# Patient Record
Sex: Male | Born: 1960
Health system: Southern US, Community
[De-identification: ages and names within clinical notes are randomized; demographics above are authoritative.]

## PROBLEM LIST (undated history)

## (undated) DIAGNOSIS — E119 Type 2 diabetes mellitus without complications: Secondary | ICD-10-CM

## (undated) DIAGNOSIS — K219 Gastro-esophageal reflux disease without esophagitis: Secondary | ICD-10-CM

## (undated) DIAGNOSIS — E7800 Pure hypercholesterolemia, unspecified: Secondary | ICD-10-CM

## (undated) DIAGNOSIS — G4733 Obstructive sleep apnea (adult) (pediatric): Secondary | ICD-10-CM

## (undated) DIAGNOSIS — K824 Cholesterolosis of gallbladder: Secondary | ICD-10-CM

## (undated) DIAGNOSIS — G4730 Sleep apnea, unspecified: Secondary | ICD-10-CM

## (undated) DIAGNOSIS — I2510 Atherosclerotic heart disease of native coronary artery without angina pectoris: Secondary | ICD-10-CM

## (undated) DIAGNOSIS — K449 Diaphragmatic hernia without obstruction or gangrene: Secondary | ICD-10-CM

## (undated) DIAGNOSIS — K2270 Barrett's esophagus without dysplasia: Secondary | ICD-10-CM

## (undated) DIAGNOSIS — K317 Polyp of stomach and duodenum: Secondary | ICD-10-CM

## (undated) DIAGNOSIS — I82409 Acute embolism and thrombosis of unspecified deep veins of unspecified lower extremity: Secondary | ICD-10-CM

## (undated) DIAGNOSIS — K760 Fatty (change of) liver, not elsewhere classified: Secondary | ICD-10-CM

## (undated) DIAGNOSIS — F419 Anxiety disorder, unspecified: Secondary | ICD-10-CM

## (undated) DIAGNOSIS — R7989 Other specified abnormal findings of blood chemistry: Secondary | ICD-10-CM

## (undated) DIAGNOSIS — R945 Abnormal results of liver function studies: Secondary | ICD-10-CM

## (undated) HISTORY — DX: Diaphragmatic hernia without obstruction or gangrene: K44.9

## (undated) HISTORY — DX: Other specified abnormal findings of blood chemistry: R79.89

## (undated) HISTORY — DX: Obstructive sleep apnea (adult) (pediatric): G47.33

## (undated) HISTORY — DX: Cholesterolosis of gallbladder: K82.4

## (undated) HISTORY — DX: Acute embolism and thrombosis of unspecified deep veins of unspecified lower extremity: I82.409

## (undated) HISTORY — DX: Type 2 diabetes mellitus without complications: E11.9

## (undated) HISTORY — DX: Barrett's esophagus without dysplasia: K22.70

## (undated) HISTORY — DX: Gastro-esophageal reflux disease without esophagitis: K21.9

## (undated) HISTORY — DX: Atherosclerotic heart disease of native coronary artery without angina pectoris: I25.10

## (undated) HISTORY — DX: Polyp of stomach and duodenum: K31.7

## (undated) HISTORY — DX: Fatty (change of) liver, not elsewhere classified: K76.0

## (undated) HISTORY — DX: Abnormal results of liver function studies: R94.5

## (undated) HISTORY — DX: Anxiety disorder, unspecified: F41.9

## (undated) HISTORY — DX: Pure hypercholesterolemia, unspecified: E78.00

## (undated) HISTORY — PX: COLONOSCOPY: SHX174

## (undated) HISTORY — DX: Sleep apnea, unspecified: G47.30

## (undated) HISTORY — PX: UPPER GASTROINTESTINAL ENDOSCOPY: SHX188

---

## 1999-01-15 ENCOUNTER — Other Ambulatory Visit: Admission: RE | Admit: 1999-01-15 | Discharge: 1999-01-15 | Payer: Self-pay | Admitting: Gastroenterology

## 1999-01-15 ENCOUNTER — Encounter (INDEPENDENT_AMBULATORY_CARE_PROVIDER_SITE_OTHER): Payer: Self-pay

## 2004-12-12 ENCOUNTER — Emergency Department (HOSPITAL_COMMUNITY): Admission: EM | Admit: 2004-12-12 | Discharge: 2004-12-12 | Payer: Self-pay | Admitting: Emergency Medicine

## 2006-05-06 ENCOUNTER — Emergency Department (HOSPITAL_COMMUNITY): Admission: EM | Admit: 2006-05-06 | Discharge: 2006-05-06 | Payer: Self-pay | Admitting: Family Medicine

## 2007-03-22 ENCOUNTER — Observation Stay (HOSPITAL_COMMUNITY): Admission: EM | Admit: 2007-03-22 | Discharge: 2007-03-23 | Payer: Self-pay | Admitting: Emergency Medicine

## 2008-02-17 HISTORY — PX: CARDIAC CATHETERIZATION: SHX172

## 2008-04-22 ENCOUNTER — Emergency Department (HOSPITAL_COMMUNITY): Admission: EM | Admit: 2008-04-22 | Discharge: 2008-04-22 | Payer: Self-pay | Admitting: Emergency Medicine

## 2008-07-29 ENCOUNTER — Emergency Department (HOSPITAL_COMMUNITY): Admission: EM | Admit: 2008-07-29 | Discharge: 2008-07-29 | Payer: Self-pay | Admitting: Emergency Medicine

## 2008-11-22 ENCOUNTER — Inpatient Hospital Stay (HOSPITAL_BASED_OUTPATIENT_CLINIC_OR_DEPARTMENT_OTHER): Admission: RE | Admit: 2008-11-22 | Discharge: 2008-11-22 | Payer: Self-pay | Admitting: Cardiology

## 2008-11-27 ENCOUNTER — Ambulatory Visit (HOSPITAL_COMMUNITY): Admission: RE | Admit: 2008-11-27 | Discharge: 2008-11-27 | Payer: Self-pay | Admitting: Cardiology

## 2008-12-24 ENCOUNTER — Ambulatory Visit: Payer: Self-pay | Admitting: Gastroenterology

## 2009-01-14 ENCOUNTER — Ambulatory Visit: Payer: Self-pay | Admitting: Gastroenterology

## 2009-01-18 ENCOUNTER — Encounter: Payer: Self-pay | Admitting: Gastroenterology

## 2009-01-23 ENCOUNTER — Telehealth: Payer: Self-pay | Admitting: Gastroenterology

## 2009-01-23 DIAGNOSIS — R079 Chest pain, unspecified: Secondary | ICD-10-CM | POA: Insufficient documentation

## 2009-01-23 DIAGNOSIS — K219 Gastro-esophageal reflux disease without esophagitis: Secondary | ICD-10-CM | POA: Insufficient documentation

## 2009-01-30 ENCOUNTER — Ambulatory Visit (HOSPITAL_COMMUNITY): Admission: RE | Admit: 2009-01-30 | Discharge: 2009-01-30 | Payer: Self-pay | Admitting: Gastroenterology

## 2009-01-30 ENCOUNTER — Encounter: Payer: Self-pay | Admitting: Gastroenterology

## 2009-01-30 DIAGNOSIS — Z8679 Personal history of other diseases of the circulatory system: Secondary | ICD-10-CM | POA: Insufficient documentation

## 2009-01-30 DIAGNOSIS — K2270 Barrett's esophagus without dysplasia: Secondary | ICD-10-CM | POA: Insufficient documentation

## 2009-01-30 DIAGNOSIS — R1310 Dysphagia, unspecified: Secondary | ICD-10-CM | POA: Insufficient documentation

## 2009-01-30 DIAGNOSIS — K824 Cholesterolosis of gallbladder: Secondary | ICD-10-CM | POA: Insufficient documentation

## 2009-02-26 ENCOUNTER — Ambulatory Visit: Payer: Self-pay | Admitting: Gastroenterology

## 2009-02-26 DIAGNOSIS — K7581 Nonalcoholic steatohepatitis (NASH): Secondary | ICD-10-CM | POA: Insufficient documentation

## 2009-03-16 IMAGING — CR DG CHEST 1V PORT
1 series · 1 of 1 positions shown · non-contrast
Comparison: none

CLINICAL DATA: Chest pain.
 PORTABLE CHEST- 1 VIEW:

[AP]
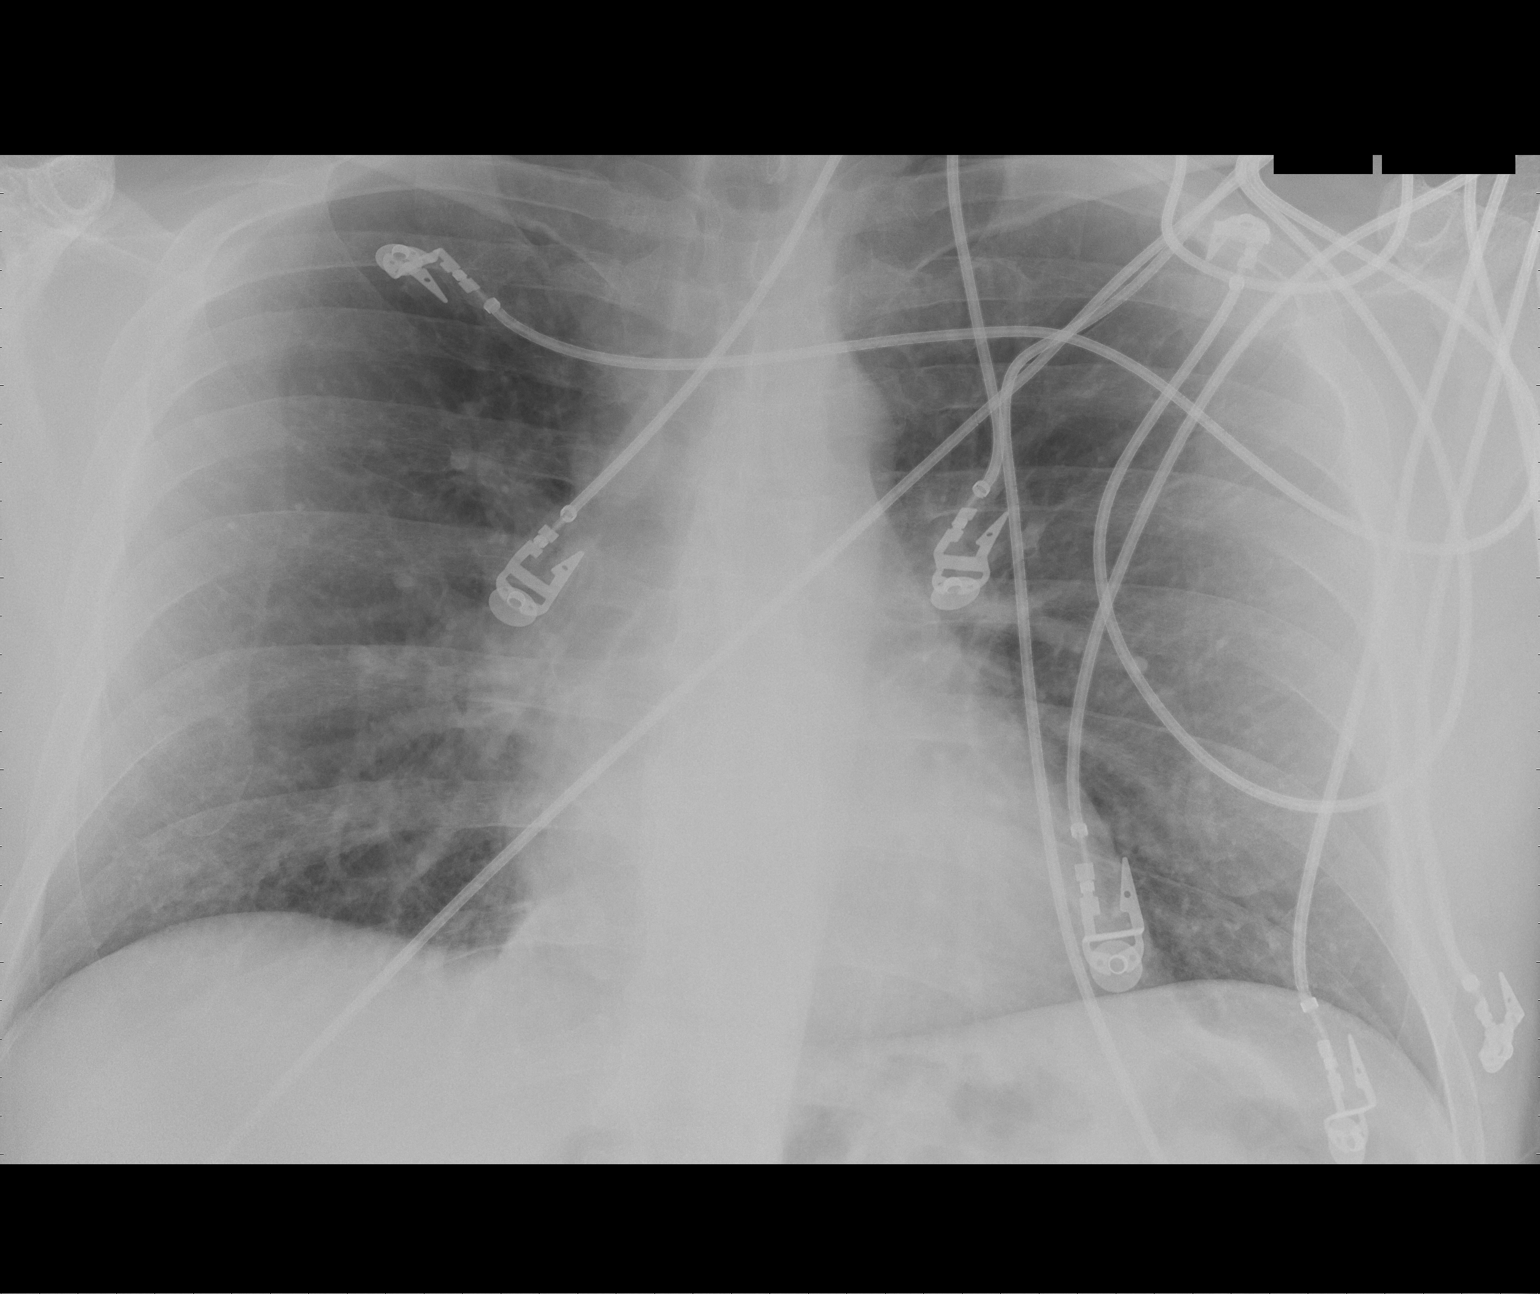

[1 of 1 positions shown; findings below may reference images not displayed]

FINDINGS: Lungs clear.  Heart size normal.  No effusion or focal bony abnormality.
IMPRESSION: No acute disease.

## 2009-04-08 ENCOUNTER — Encounter: Payer: Self-pay | Admitting: Gastroenterology

## 2010-02-11 ENCOUNTER — Encounter: Payer: Self-pay | Admitting: Gastroenterology

## 2010-03-18 NOTE — Assessment & Plan Note (Signed)
Summary: OV re Ultrasound/dfs   History of Present Illness Visit Type: Initial Visit Primary GI MD: Sheryn Bison MD FACP FAGA Primary Provider: Lajean Manes, MD Chief Complaint: Barrett's esophagus History of Present Illness:   This patient is a 50 year old white male with chronic GERD benign recurrent hiatal hernia with an associated esophageal motility disturbance associated with lower esophageal sphincter incompetency and hypo--motility in the distal two thirds of his esophagus. He recently has had recurrent subxiphoid chest pain with a negative cardiac evaluation and a negative ultrasound exam except for gallbladder polyp and mild fatty infiltration of his liver with normal liver function tests. He is obese and has hyperlipidemia. He denies hepatobiliary complaints. Recent endoscopy did confirm a short segment of Barrett's mucosa. Since increasing his AcipHex to 20 mg twice a day he's had no further chest pain.  Review of his workup does show some prominent gastric polyps but these were hypoplastic with mucosal biopsies. He denies lower gastrointestinal problems at this time. He has gained approximately 20 pounds in weight over the last few years.   GI Review of Systems    Reports acid reflux, belching, chest pain, and  weight gain.      Denies abdominal pain, bloating, dysphagia with liquids, heartburn, loss of appetite, nausea, vomiting, vomiting blood, and  weight loss.        Denies anal fissure, black tarry stools, change in bowel habit, constipation, diarrhea, diverticulosis, fecal incontinence, heme positive stool, hemorrhoids, irritable bowel syndrome, jaundice, light color stool, liver problems, rectal bleeding, and  rectal pain. Preventive Screening-Counseling & Management  Alcohol-Tobacco     Smoking Status: never      Drug Use:  no.      Current Medications (verified): 1)  Aciphex 20 Mg  Tbec (Rabeprazole Sodium) .Marland Kitchen.. 1 By Mouth Bid  Allergies (verified): No Known  Drug Allergies  Past History:  Past medical, surgical, family and social histories (including risk factors) reviewed for relevance to current acute and chronic problems.  Past Medical History: Reviewed history from 01/30/2009 and no changes required. Current Problems:  CHEST PAIN, HX OF (ICD-V12.50) DYSPHAGIA UNSPECIFIED (ICD-787.20) GASTRIC POLYP (ICD-211.1) BARRETTS ESOPHAGUS (ICD-530.85) GERD (ICD-530.81) CHEST PAIN (ICD-786.50)  Family History: Reviewed history and no changes required. No FH of Colon Cancer: Family History of Diabetes: Mother & Father  Social History: Reviewed history and no changes required. 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  Alcohol Use - no Daily Caffeine Use -4 Illicit Drug Use - no Smoking Status:  never Drug Use:  no  Review of Systems       The patient complains of shortness of breath and sleeping problems.  The patient denies allergy/sinus, anemia, anxiety-new, arthritis/joint pain, back pain, blood in urine, breast changes/lumps, change in vision, confusion, cough, coughing up blood, depression-new, fainting, fatigue, fever, headaches-new, hearing problems, heart murmur, heart rhythm changes, itching, menstrual pain, muscle pains/cramps, night sweats, nosebleeds, pregnancy symptoms, skin rash, sore throat, swollen lymph glands, thirst - excessive , urination - excessive , urination changes/pain, urine leakage, vision changes, and voice change.    Vital Signs:  Patient profile:   50 year old male Height:      77 inches Weight:      283.38 pounds BMI:     33.73 Pulse rate:   92 / minute Pulse rhythm:   regular BP sitting:   124 / 78  (left arm) Cuff size:   large  Vitals Entered By: June McMurray CMA Duncan Dull) (February 26, 2009  3:32 PM)  Physical Exam  General:  Well developed, well nourished, no acute distress.He is a very large individual who is very fit but has mild obesity. Head:  Normocephalic and  atraumatic. Eyes:  PERRLA, no icterus.exam deferred to patient's ophthalmologist.   Neck:  Supple; no masses or thyromegaly. Lungs:  Clear throughout to auscultation. Heart:  Regular rate and rhythm; no murmurs, rubs,  or bruits. Abdomen:  Soft, nontender and nondistended. No masses, hepatosplenomegaly or hernias noted. Normal bowel sounds.I cannot appreciate hepatosplenomegaly or abdominal masses. Pulses:  Normal pulses noted. Extremities:  No clubbing, cyanosis, edema or deformities noted. Neurologic:  Alert and  oriented x4;  grossly normal neurologically. Cervical Nodes:  No significant cervical adenopathy. Psych:  Alert and cooperative. Normal mood and affect.   Impression & Recommendations:  Problem # 1:  CHEST PAIN, HX OF (ICD-V12.50) Assessment Improved his chest pain is consistent with GERD and his known hiatal hernia.He is currently much improved on b.i.d. AcipHex therapy. I have reviewed his endoscopic findings with him and also a reflux regime and the need for twice a day PPI therapy. He is not a good candidate for fundoplication because of his associated motility disturbance. I have suggested to him that he try to lose 25-30 pounds in weight which will help his fatty liver and has GERD  Problem # 2:  DYSPHAGIA UNSPECIFIED (ICD-787.20) Assessment: Improved  Problem # 3:  BARRETTS ESOPHAGUS (ICD-530.85) Assessment: Unchanged He has a short segment of Barrett's because of which is low risk for carcinoma development. Because of his gastric polyposis, I suggested every two-year endoscopic surveillance however.  Problem # 4:  FATTY LIVER DISEASE (ICD-571.8) Assessment: Unchanged There is no hepatomegaly on physical exam or stigmata of chronic liver disease. Review of his liver function tests showed that these parameters are normal. Still, weight loss reduction is in order and a gradual graded common sense approach with continued aerobic exercise planning.  Patient  Instructions: 1)  Copy sent to : Dr. Lajean Manes 2)  Please continue current medications.  3)  Fatty Liver handout given.  4)  Avoid foods high in acid content ( tomatoes, citrus juices, spicy foods) . Avoid eating within 3 to 4 hours of lying down or before exercising. Do not over eat; try smaller more frequent meals. Elevate head of bed four inches when sleeping.  5)  Yearly GI followup with endoscopy at two-year intervals. 6)  Aciphex Rx sent to pharmacy. 7)  The medication list was reviewed and reconciled.  All changed / newly prescribed medications were explained.  A complete medication list was provided to the patient / caregiver. Prescriptions: ACIPHEX 20 MG  TBEC (RABEPRAZOLE SODIUM) 1 by mouth bid  #180 x 3   Entered by:   Ashok Cordia RN   Authorized by:   Mardella Layman MD [facility]   Signed by:   Ashok Cordia RN on 02/26/2009   Method used:   Electronically to        [facility] [address] (retail)       [address]       [place], [place]  [address]       Ph: [phone]       Fax: [phone]   RxID:   [id number]

## 2010-03-18 NOTE — Medication Information (Signed)
Summary: Approved/[employer]  Approved/[employer]   Imported By: [employer] 04/15/2009 09:09:53  _____________________________________________________________________  External Attachment:    Type:   Image     Comment:   External Document

## 2010-03-20 NOTE — Letter (Signed)
Summary: Office Visit Letter  [facility]  [address]   [place], [place] [address]   Phone: [phone]  Fax: [phone]      February 11, 2010 MRN: [id number]   Timothy Dixon [address] [place], [place]  [address]   Dear [name],   According to our records, it is time for you to schedule a follow-up office visit with [place].   At your convenience, please call [phone] (option #2)to schedule an office visit. If you have any questions, concerns, or feel that this letter is in error, we would appreciate your call.   Sincerely,   Sheryn Bison M.D.  [facility] [phone]

## 2010-04-03 ENCOUNTER — Telehealth: Payer: Self-pay | Admitting: Gastroenterology

## 2010-04-09 NOTE — Progress Notes (Signed)
Summary: Med refill  Phone Note Call from Patient Call back at Home Phone [phone]   Caller: Patient Call For: Dr. Jarold Motto Reason for Call: Refill Medication Summary of Call: REFILL ON aciphex to [facility] Initial call taken by: [place] Johnson,  April 03, 2010 2:46 PM  Follow-up for Phone Call        [facility] for patient to return our call. Graciella Freer RN  April 03, 2010 3:26 PM   [facility] for patient stating  I ordered his ACIPHEX and reminded him of his 04/15/10 appointment with Dr Jarold Motto. Follow-up by: Graciella Freer RN,  April 04, 2010 8:13 AM    New/Updated Medications: ACIPHEX 20 MG  TBEC (RABEPRAZOLE SODIUM) 1 by mouth bid Prescriptions: ACIPHEX 20 MG  TBEC (RABEPRAZOLE SODIUM) 1 by mouth bid  #180 x 3   Entered by:   Graciella Freer RN   Authorized by:   Mardella Layman MD [facility]   Signed by:   Graciella Freer RN on 04/03/2010   Method used:   Electronically to        [facility] [address] (retail)       [address], [address]       [place], [place]  [address]       Ph: [phone]       Fax: [phone]   RxID:   [id number]

## 2010-04-15 ENCOUNTER — Encounter: Payer: Self-pay | Admitting: Gastroenterology

## 2010-04-15 ENCOUNTER — Other Ambulatory Visit: Payer: Self-pay | Admitting: Gastroenterology

## 2010-04-15 ENCOUNTER — Other Ambulatory Visit: Payer: 59

## 2010-04-15 ENCOUNTER — Ambulatory Visit (INDEPENDENT_AMBULATORY_CARE_PROVIDER_SITE_OTHER): Payer: 59 | Admitting: Gastroenterology

## 2010-04-15 DIAGNOSIS — K7689 Other specified diseases of liver: Secondary | ICD-10-CM

## 2010-04-15 DIAGNOSIS — K219 Gastro-esophageal reflux disease without esophagitis: Secondary | ICD-10-CM

## 2010-04-15 DIAGNOSIS — K2270 Barrett's esophagus without dysplasia: Secondary | ICD-10-CM

## 2010-04-15 LAB — BASIC METABOLIC PANEL
BUN: 17 mg/dL (ref 6–23)
CO2: 28 mEq/L (ref 19–32)
Chloride: 105 mEq/L (ref 96–112)
Creatinine, Ser: 1.4 mg/dL (ref 0.4–1.5)

## 2010-04-15 LAB — CBC WITH DIFFERENTIAL/PLATELET
Basophils Relative: 0.3 % (ref 0.0–3.0)
Hemoglobin: 14.5 g/dL (ref 13.0–17.0)
Lymphocytes Relative: 31.6 % (ref 12.0–46.0)
Monocytes Relative: 13.2 % — ABNORMAL HIGH (ref 3.0–12.0)
Neutro Abs: 3.2 10*3/uL (ref 1.4–7.7)
RBC: 5.09 Mil/uL (ref 4.22–5.81)

## 2010-04-15 LAB — HEPATIC FUNCTION PANEL
ALT: 93 U/L — ABNORMAL HIGH (ref 0–53)
Bilirubin, Direct: 0.1 mg/dL (ref 0.0–0.3)
Total Protein: 7.1 g/dL (ref 6.0–8.3)

## 2010-04-15 LAB — IBC PANEL: Transferrin: 259.8 mg/dL (ref 212.0–360.0)

## 2010-04-15 LAB — VITAMIN B12: Vitamin B-12: 263 pg/mL (ref 211–911)

## 2010-04-16 ENCOUNTER — Encounter (INDEPENDENT_AMBULATORY_CARE_PROVIDER_SITE_OTHER): Payer: Self-pay | Admitting: *Deleted

## 2010-04-18 ENCOUNTER — Other Ambulatory Visit: Payer: 59

## 2010-04-24 NOTE — Assessment & Plan Note (Signed)
Summary: Recall   History of Present Illness Visit Type: Follow-up Visit Primary GI MD: Sheryn Bison MD FACP FAGA Primary Provider: Lajean Manes, MD Chief Complaint: GERD, hiatal hernia pushing up, need refills of Aciphex History of Present Illness:   50 year old white male with chronic acid reflux with a related  esophageal motility disorder. He is doing well on AcipHex 20 mg twice a day and denies dysphagia. He has had some atypical chest pain and apparently has undergone cardiac catheterization, and was told that he was without coronary artery disease. He does have Barrett's mucosa with endoscopy one year ago. He denies other gastrointestinal or general medical issues. Family history is noncontributory. There no lower gastrointestinal hepatobiliary symptoms. Previous ultrasound did confirm a gallbladder polyp and fatty infiltration of the liver. His BMI is elevated at 34.31.   GI Review of Systems    Reports abdominal pain, acid reflux, bloating, and  nausea.     Location of  Abdominal pain: epigastric area.    Denies belching, chest pain, dysphagia with liquids, dysphagia with solids, heartburn, loss of appetite, vomiting, vomiting blood, weight loss, and  weight gain.        Denies anal fissure, black tarry stools, change in bowel habit, constipation, diarrhea, diverticulosis, fecal incontinence, heme positive stool, hemorrhoids, irritable bowel syndrome, jaundice, light color stool, liver problems, rectal bleeding, and  rectal pain.    Current Medications (verified): 1)  Aciphex 20 Mg  Tbec (Rabeprazole Sodium) .Marland Kitchen.. 1 By Mouth Bid 2)  Trilipix 45 Mg Cpdr (Choline Fenofibrate) .... Take 1 By Mouth Once Daily  Allergies (verified): No Known Drug Allergies  Past History:  Past medical, surgical, family and social histories (including risk factors) reviewed for relevance to current acute and chronic problems.  Past Medical History: Reviewed history from 01/30/2009 and no changes  required. Current Problems:  CHEST PAIN, HX OF (ICD-V12.50) DYSPHAGIA UNSPECIFIED (ICD-787.20) GASTRIC POLYP (ICD-211.1) BARRETTS ESOPHAGUS (ICD-530.85) GERD (ICD-530.81) CHEST PAIN (ICD-786.50)  Family History: Reviewed history from 02/26/2009 and no changes required. No FH of Colon Cancer: Family History of Diabetes: Mother & Father  Social History: Reviewed history from 02/26/2009 and no changes required. 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  Alcohol Use - no Daily Caffeine Use -4 Illicit Drug Use - no  Review of Systems  The patient denies allergy/sinus, anemia, anxiety-new, arthritis/joint pain, back pain, blood in urine, breast changes/lumps, change in vision, confusion, cough, coughing up blood, depression-new, fainting, fatigue, fever, headaches-new, hearing problems, heart murmur, heart rhythm changes, itching, menstrual pain, muscle pains/cramps, night sweats, nosebleeds, pregnancy symptoms, shortness of breath, skin rash, sleeping problems, sore throat, swelling of feet/legs, swollen lymph glands, thirst - excessive , urination - excessive , urination changes/pain, urine leakage, vision changes, and voice change.         he does relate periodic episodes of noncardiac chest pain and also a fullness in the subxiphoid area but no true dysphasia or painful swallowing.  Vital Signs:  Patient profile:   50 year old male Height:      77 inches Weight:      288.25 pounds BMI:     34.31 Pulse rate:   68 / minute Pulse rhythm:   regular BP sitting:   126 / 84  (left arm) Cuff size:   large  Vitals Entered By: June McMurray CMA Duncan Dull) (April 15, 2010 1:54 PM)  Physical Exam  General:  Well developed, well nourished, no acute distress.obese.   Head:  Normocephalic and  atraumatic. Eyes:  PERRLA, no icterus. Lungs:  Clear throughout to auscultation. Heart:  Regular rate and rhythm; no murmurs, rubs,  or bruits. Abdomen:  Soft, nontender and  nondistended. No masses, hepatosplenomegaly or hernias noted. Normal bowel sounds. Pulses:  Normal pulses noted. Extremities:  No clubbing, cyanosis, edema or deformities noted. Neurologic:  Alert and  oriented x4;  grossly normal neurologically. Cervical Nodes:  No significant cervical adenopathy. Psych:  Alert and cooperative. Normal mood and affect.   Impression & Recommendations:  Problem # 1:  FATTY LIVER DISEASE (ICD-571.8) Assessment Unchanged labs ordered for review. He has no stigmata of chronic liver disease or hepatobiliary symptomatology. He is obese with a BMI 34.31 Orders: TLB-CBC Platelet - w/Differential (85025-CBCD) TLB-BMP (Basic Metabolic Panel-BMET) (80048-METABOL) TLB-Hepatic/Liver Function Pnl (80076-HEPATIC) TLB-TSH (Thyroid Stimulating Hormone) (84443-TSH) TLB-B12, Serum-Total ONLY (04540-J81) TLB-Ferritin (82728-FER) TLB-Folic Acid (Folate) (82746-FOL) TLB-IBC Pnl (Iron/FE;Transferrin) (83550-IBC)  Problem # 2:  BARRETTS ESOPHAGUS (ICD-530.85) Assessment: Unchanged Endoscopic exam and dysplasia screening every 3 years. With his next endoscopic exam we will also do colonoscopy at age 67-50 as a screening procedure. Anemia profile and CBC ordered today. Orders: TLB-CBC Platelet - w/Differential (85025-CBCD) TLB-BMP (Basic Metabolic Panel-BMET) (80048-METABOL) TLB-Hepatic/Liver Function Pnl (80076-HEPATIC) TLB-TSH (Thyroid Stimulating Hormone) (84443-TSH) TLB-B12, Serum-Total ONLY (19147-W29) TLB-Ferritin (82728-FER) TLB-Folic Acid (Folate) (82746-FOL) TLB-IBC Pnl (Iron/FE;Transferrin) (83550-IBC)  Problem # 3:  DYSPHAGIA UNSPECIFIED (ICD-787.20) Assessment: Improved  Orders: TLB-CBC Platelet - w/Differential (85025-CBCD) TLB-BMP (Basic Metabolic Panel-BMET) (80048-METABOL) TLB-Hepatic/Liver Function Pnl (80076-HEPATIC) TLB-TSH (Thyroid Stimulating Hormone) (84443-TSH) TLB-B12, Serum-Total ONLY (56213-Y86) TLB-Ferritin (82728-FER) TLB-Folic Acid  (Folate) (82746-FOL) TLB-IBC Pnl (Iron/FE;Transferrin) (83550-IBC)  Problem # 4:  GASTRIC POLYP (ICD-211.1) Assessment: Unchanged secondary to PPI usage. Biopsies confirmed hyperplastic benign fundal polyps.  Patient Instructions: 1)  Copy sent to : Lajean Manes, MD 2)  Please continue current medications.  3)  Please schedule a follow-up appointment in 1 year. 4)  Please go to the basement today for your labs.  5)  The medication list was reviewed and reconciled.  All changed / newly prescribed medications were explained.  A complete medication list was provided to the patient / caregiver. 6)  Avoid foods high in acid content ( tomatoes, citrus juices, spicy foods) . Avoid eating within 3 to 4 hours of lying down or before exercising. Do not over eat; try smaller more frequent meals. Elevate head of bed four inches when sleeping.

## 2010-04-24 NOTE — Letter (Signed)
Summary: Lab visit  [facility]  [address] [place], [place] [address]   Phone: [phone]  Fax: [phone]        April 16, 2010 MRN: [id number]    GARVIS DOWNUM [address] [place], [place]  [address]    Dear Mr. SHEPPARD,   We have been unable to reach you by phone to schedule a lab   appointment that was recommended for you by Dr. Jarold Motto. It is very   important that we reach you to schedule a lab appointment. We hope that   you allow [place] to participate in your health care needs. Please contact us   [phone] at your earliest convenience to schedule your   appointment.     Sincerely,    Harlow Mares CMA (AAMA)  Appended Document: Lab visit mailed today

## 2010-05-12 ENCOUNTER — Telehealth: Payer: Self-pay | Admitting: *Deleted

## 2010-05-12 DIAGNOSIS — K7689 Other specified diseases of liver: Secondary | ICD-10-CM

## 2010-05-12 NOTE — Telephone Encounter (Signed)
Patient needs lfts. Left a message on patients machine to call back, I have already mailed letter and called multiple times about the lfts no response. He noshowed last labs.

## 2010-05-15 NOTE — Telephone Encounter (Signed)
Spoke with pt and he will come for repeat lfts but would like to wait on the hep labs since he knows he has fatty liver. He has asked for a referral to a dietian.And I will fax records and let him know when I hear something about his appt. He would like to know if his trilipix would make his lfts elevated if so he would like to know if there is another medication that you would recommend for his cholesterol.

## 2010-05-16 NOTE — Telephone Encounter (Signed)
Patient will come for lfts in one month and will stop his trilipix for one month.

## 2010-05-16 NOTE — Telephone Encounter (Signed)
Stop trilipex for 1 month and repeat LFT's.HPI: This is a          Assessment and plan:document   his refusal for viral hepatitis.W?U.Marland KitchenMarland Kitchen

## 2010-06-12 ENCOUNTER — Telehealth: Payer: Self-pay | Admitting: *Deleted

## 2010-06-12 NOTE — Telephone Encounter (Signed)
i have left a message for the patient to call back about his referral to the dietician, they have called several time but never got a call back.

## 2010-06-12 NOTE — Telephone Encounter (Signed)
Message copied by Harlow Mares on Thu Jun 12, 2010  8:25 AM ------      Message from: Harlow Mares      Created: Mon May 19, 2010  9:23 AM       Check on referral.                        ----- Message -----         From: Harlow Mares, CMA         Sent: 05/16/2010   1:29 PM           To: Harlow Mares, CMA            Fax records to dietian.

## 2010-07-01 NOTE — H&P (Signed)
NAME:  NIKOLOZ, HUY NO.:  [id number]   MEDICAL RECORD NO.:  [id number]          PATIENT TYPE:  EMS   LOCATION:  [facility]                         FACILITY:  [facility]   PHYSICIAN:  Sabino Donovan, MD        DATE OF BIRTH:  [birth year]   DATE OF ADMISSION:  03/22/2007  DATE OF DISCHARGE:                              HISTORY & PHYSICAL   PRIMARY CARE PHYSICIAN:  Oley Balm. Georgina Pillion, M.D.   CHIEF COMPLAINT:  Chest pain.   HISTORY OF PRESENT ILLNESS:  A 50 year old white male with history of  hyperlipidemia, GERD and anxiety who presented complaining of chest pain  that started about 1 p.m. while he was doing some paperwork at work.  The patient is head of a crime lab and reports he was not in any unusual  stress.  Pain initially lasted about four hours.  It was a sharp, left  sided pressure-like, nonradiating.  It was associated with mild  shortness of breath, but otherwise, denied any associated nausea,  sweating.  He reports that is was significantly different from his  previous heartburn symptoms.  The patient subsequently went to urgent  care where he was noted to have high blood pressure of systolic 160-170,  and he was subsequently sent to [facility].  The patient did  continue to have some mild chest pain which lasted for another 4 hours,  total it lasted around 8 hours.  He denies any history of smoking.  Denies any aspirin use or any IV drug use.  He denies any family history  of coronary artery disease in his family.  He denies any symptoms of  heart failure including dyspnea on exertion, pedal edema, orthopnea or  PND.   PAST MEDICAL HISTORY:  1. Hyperlipidemia, reports that he used to have problems with his high      cholesterol, but he has elevated triglycerides for which he takes      Lopressor 40 mg once a day.  2. GERD.  3. Anxiety.   FAMILY HISTORY:  Mom and dad with diabetes.  Dad has hypertension.  Grandparent has coronary artery disease in the  age of 82s and 20s.   SOCIAL HISTORY:  No tobacco, no alcohol, no IV drug use.   ALLERGIES:  None.   MEDICATIONS:  1. Lopid 600 mg p.o. once a day.  2. Aciphex 20 mg p.o. once a day.  3. Zoloft 50 mg p.o. once a day.   REVIEW OF SYSTEMS:  Otherwise, unremarkable.   PHYSICAL EXAMINATION:  VITAL SIGNS:  Temperature 97.8, pulse 75,  respirations 18, blood pressure on presentation was 158/74, now is  116/73.  GENERAL:  No acute distress.  HEENT:  PERRLA.  EOMI.  NECK:  No lymphadenopathy or thyromegaly.  No bruits.  CHEST:  Clear to auscultation bilaterally.  HEART:  Regular rate and rhythm.  No murmurs, rubs or gallops.  No  heaves, no thrills.  ABDOMEN:  Soft, nontender, nondistended.  Normoactive bowel sounds.  EXTREMITIES:  No cyanosis, clubbing or edema.  Normal pedal pulses.   LABORATORY DATA:  Sodium 137, potassium  4.4, BUN 19, creatinine 1.3,  hemoglobin 14.6, hematocrit 42.6, troponin less than 0.05 x2.  D. dimer  was less than 0.22.  EKG shows normal sinus rhythm, normal axis and no  ST-T wave changes.  Chest x-ray was pending at the time of my interview.   IMPRESSION:  The patient is a 50 year old white male with history of  hyperlipidemia who presented with complaints of atypical chest pain.  1. Chest pain appears to be atypical in nature and lasted for about 8      hours without any change in EKG or any troponin leak.  Further, his      only risk factors for coronary artery disease is elevated      triglycerides.  We will rule him out for acute MI and we will start      the patient on aspirin 325 mg p.o. daily.  Will check TSH, free T4      as well as BNP and fasting lipids.  Will order a stress      echocardiogram for the morning.  However, it could be done as an      outpatient as well.  2. Hypertension.  On presentation, the patient's blood pressure was      158/74.  At this time, his blood pressure is 116/73 on      nitroglycerin drip.  He has no history of  elevated blood pressure      in the past.  At this time, I will discontinue his nitroglycerin as      he is chest pain free and monitor his blood pressure.  I suspect      that elevated blood pressure is due to anxiety.  Although, his      blood pressure remains elevated, we will start the patient on ACE      inhibitor or beta blockers.  3. Hyperlipidemia.  Will check fasting lipids, otherwise, continue      Lopid.  4. Gastroesophageal reflux disease.  Continue Aciphex.  5. Anxiety.  Continue Zoloft.      Sabino Donovan, MD  Electronically Signed     MJ/MEDQ  D:  03/22/2007  T:  03/23/2007  Job:  [id number]

## 2010-07-01 NOTE — Discharge Summary (Signed)
NAME:  Timothy Dixon, Timothy Dixon            ACCOUNT NO.:  [id number]   MEDICAL RECORD NO.:  [id number]          PATIENT TYPE:  INP   LOCATION:  4737                         FACILITY:  [facility]   PHYSICIAN:  Corinna L. Lendell Caprice, MDDATE OF BIRTH:  1960/04/10   DATE OF ADMISSION:  03/22/2007  DATE OF DISCHARGE:                               DISCHARGE SUMMARY   DISCHARGE DIAGNOSES:  1. Chest pain, myocardial infarction ruled out.  2. Hypertriglyceridemia.  3. Gastroesophageal reflux disease.  4. Anxiety.   DISCHARGE MEDICATIONS:  Continue Lopid, AcipHex, Zoloft as previous.  Also take an aspirin 81 mg a day.   DIET:  Should be low-fat.   CONDITION:  Stable.   FOLLOW UP:  [facility] for outpatient stress test.   CONSULTATIONS:  None.   PROCEDURES:  None.   ACTIVITY:  Ad lib.   LABORATORY DATA:  Serial cardiac enzymes were negative.  CBC  unremarkable, D-dimer less than 0.22.  complete metabolic panel  significant for an SGPT of 90, otherwise unremarkable.  BNP less than  30.  Free T4 1.14, TSH 2.634.  D-dimer less than 0.22.   SPECIAL STUDIES/RADIOLOGY:  EKG showed normal sinus rhythm.   Chest x-ray negative.   HISTORY AND HOSPITAL COURSE:  Timothy Dixon is a 50 year old white male  who presented with substernal chest pressure which radiated to the right  chest.  He had no accompanying symptoms, but lasted for several hours  and was resolved after sublingual nitroglycerin.  He was admitted to  telemetry where he remained in normal sinus rhythm.  He ruled out for MI  and his D-dimer was negative.  I discussed the case with the PA for  [facility]  and they will arrange an outpatient stress Cardiolite.  He reportedly  had a negative stress test several years ago.  Please see H&P for  complete admission details.  On the day of discharge, he had normal  vital signs and a normal physical examination.  His chest pain was gone.      Corinna L. Lendell Caprice, MD  Electronically  Signed     CLS/MEDQ  D:  03/23/2007  T:  03/24/2007  Job:  [id number]   cc:   Timothy Dixon, M.D.  Timothy Bathe, MD

## 2010-07-08 ENCOUNTER — Encounter: Payer: 59 | Attending: Gastroenterology | Admitting: *Deleted

## 2010-07-08 DIAGNOSIS — K7689 Other specified diseases of liver: Secondary | ICD-10-CM | POA: Insufficient documentation

## 2010-07-08 DIAGNOSIS — Z713 Dietary counseling and surveillance: Secondary | ICD-10-CM | POA: Insufficient documentation

## 2010-11-07 LAB — COMPREHENSIVE METABOLIC PANEL
AST: 21
Albumin: 3.9
Alkaline Phosphatase: 75
Chloride: 104
Potassium: 3.7
Sodium: 139
Total Bilirubin: 0.9
Total Protein: 6.5

## 2010-11-07 LAB — LIPID PANEL
Total CHOL/HDL Ratio: 9.1
Triglycerides: 279 — ABNORMAL HIGH
VLDL: 56 — ABNORMAL HIGH

## 2010-11-07 LAB — POCT I-STAT CREATININE: Creatinine, Ser: 1.3

## 2010-11-07 LAB — I-STAT 8, (EC8 V) (CONVERTED LAB)
Acid-Base Excess: 1
BUN: 19
Bicarbonate: 26.1 — ABNORMAL HIGH
HCT: 43
Hemoglobin: 14.6
Operator id: 196461
Sodium: 137
TCO2: 27

## 2010-11-07 LAB — CARDIAC PANEL(CRET KIN+CKTOT+MB+TROPI): CK, MB: 1.2

## 2010-11-07 LAB — CBC
MCHC: 34.8
Platelets: 163
RDW: 14.4

## 2010-11-07 LAB — D-DIMER, QUANTITATIVE

## 2010-11-07 LAB — POCT CARDIAC MARKERS
CKMB, poc: <1 — ABNORMAL LOW
Myoglobin, poc: 53.7
Operator id: 196461
Troponin i, poc: <0.05
Troponin i, poc: <0.05

## 2010-11-07 LAB — B-NATRIURETIC PEPTIDE (CONVERTED LAB): Pro B Natriuretic peptide (BNP): <30

## 2011-05-01 ENCOUNTER — Other Ambulatory Visit: Payer: Self-pay | Admitting: Occupational Medicine

## 2011-05-01 ENCOUNTER — Ambulatory Visit: Payer: Self-pay

## 2011-05-01 DIAGNOSIS — M25569 Pain in unspecified knee: Secondary | ICD-10-CM

## 2011-05-23 ENCOUNTER — Other Ambulatory Visit: Payer: Self-pay | Admitting: Gastroenterology

## 2011-06-03 ENCOUNTER — Other Ambulatory Visit: Payer: Self-pay | Admitting: Gastroenterology

## 2011-06-03 MED ORDER — RABEPRAZOLE SODIUM 20 MG PO TBEC: 20.0000 mg | DELAYED_RELEASE_TABLET | Freq: Two times a day (BID) | ORAL | Status: DC

## 2011-06-03 NOTE — Telephone Encounter (Signed)
Patient states he ran out of Aciphex yesterday and needs a refill sent to his pharmacy because he is having a lot of heartburn and indigestion lately. Told him that I will send one refill until he schedules a appt. Pt states he already scheduled a appt for 06/25/11. Pt states he would like to schedule a EGD as well. Told him to discuss this with Dr. Jarold Motto at his appt unless he wants to be triaged to be seen sooner. Pt states "no I can wait till my appt".  Told him to call back if his symptoms get worse. Pt agreed and verbalized understanding.

## 2011-06-24 ENCOUNTER — Encounter: Payer: Self-pay | Admitting: *Deleted

## 2011-06-25 ENCOUNTER — Ambulatory Visit (INDEPENDENT_AMBULATORY_CARE_PROVIDER_SITE_OTHER): Payer: 59 | Admitting: Gastroenterology

## 2011-06-25 ENCOUNTER — Encounter: Payer: Self-pay | Admitting: Gastroenterology

## 2011-06-25 ENCOUNTER — Other Ambulatory Visit (INDEPENDENT_AMBULATORY_CARE_PROVIDER_SITE_OTHER): Payer: 59

## 2011-06-25 VITALS — BP 124/80 | HR 72 | Ht 77.0 in | Wt 292.0 lb

## 2011-06-25 DIAGNOSIS — Z1211 Encounter for screening for malignant neoplasm of colon: Secondary | ICD-10-CM

## 2011-06-25 DIAGNOSIS — K224 Dyskinesia of esophagus: Secondary | ICD-10-CM

## 2011-06-25 DIAGNOSIS — K760 Fatty (change of) liver, not elsewhere classified: Secondary | ICD-10-CM

## 2011-06-25 DIAGNOSIS — E669 Obesity, unspecified: Secondary | ICD-10-CM

## 2011-06-25 DIAGNOSIS — K222 Esophageal obstruction: Secondary | ICD-10-CM

## 2011-06-25 DIAGNOSIS — K7689 Other specified diseases of liver: Secondary | ICD-10-CM

## 2011-06-25 DIAGNOSIS — R1310 Dysphagia, unspecified: Secondary | ICD-10-CM

## 2011-06-25 DIAGNOSIS — K219 Gastro-esophageal reflux disease without esophagitis: Secondary | ICD-10-CM

## 2011-06-25 LAB — FERRITIN: Ferritin: 159.7 ng/mL (ref 22.0–322.0)

## 2011-06-25 LAB — CBC WITH DIFFERENTIAL/PLATELET
Basophils Absolute: 0 10*3/uL (ref 0.0–0.1)
Eosinophils Relative: 2.7 % (ref 0.0–5.0)
HCT: 42.4 % (ref 39.0–52.0)
Lymphs Abs: 2 10*3/uL (ref 0.7–4.0)
MCV: 79.9 fl (ref 78.0–100.0)
Monocytes Absolute: 0.7 10*3/uL (ref 0.1–1.0)
Platelets: 161 10*3/uL (ref 150.0–400.0)
RDW: 13.9 % (ref 11.5–14.6)

## 2011-06-25 LAB — TSH: TSH: 1.62 u[IU]/mL (ref 0.35–5.50)

## 2011-06-25 LAB — HEPATIC FUNCTION PANEL
AST: 31 U/L (ref 0–37)
Alkaline Phosphatase: 85 U/L (ref 39–117)
Bilirubin, Direct: 0.1 mg/dL (ref 0.0–0.3)
Total Bilirubin: 0.8 mg/dL (ref 0.3–1.2)

## 2011-06-25 LAB — BASIC METABOLIC PANEL
Chloride: 107 mEq/L (ref 96–112)
Potassium: 4.3 mEq/L (ref 3.5–5.1)

## 2011-06-25 MED ORDER — RABEPRAZOLE SODIUM 20 MG PO TBEC: 20.0000 mg | DELAYED_RELEASE_TABLET | Freq: Two times a day (BID) | ORAL | Status: DC

## 2011-06-25 MED ORDER — MOVIPREP 100 G PO SOLR: 1.0000 | Freq: Once | ORAL | Status: DC

## 2011-06-25 NOTE — Patient Instructions (Signed)
Your procedure has been scheduled for 07/01/2011, please follow the seperate instructions.  Your prescription(s) have been sent to you pharmacy, Aciphex Please go to the basement today for your labs.

## 2011-06-25 NOTE — Progress Notes (Signed)
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 He is on chronic AcipHex 20 mg a day, and has had 3 months of progressive solid food dysphagia. He continues to have obesity, and has associated fatty liver and also an asymptomatic gallbladder polyp. Previous esophageal motility shown lower esophageal sphincter incompetency and hypomotility in the distal two thirds of his esophagus suggestive of scleroderma esophagus. He certainly has no evidence of scleroderma or collagen vascular disease otherwise. Liver function tests have been approximately twice normal, and I do not think he has had liver biopsy. Review of his esophageal biopsies it showed no evidence of dysphagia. The patient denies lower gastrointestinal symptomatology or family history of colon cancer.  No Known Allergies Outpatient Prescriptions Prior to Visit  Medication Sig Dispense Refill  . RABEprazole (ACIPHEX) 20 MG tablet Take 1 tablet (20 mg total) by mouth 2 (two) times daily.  60 tablet  0  . Choline Fenofibrate (TRILIPIX) 45 MG capsule Take 45 mg by mouth daily.       Past Medical History  Diagnosis Date  . Hypercholesterolemia   . Sleep apnea   . Esophageal reflux   . Hiatal hernia   . Barrett esophagus   . Fatty liver   . Gastric polyp    No past surgical history on file. History   Social History  . Marital Status: Married    Spouse Name: N/A    Number of Children: N/A  . Years of Education: N/A   Occupational History  . Interior and spatial designer of [employer]   Social History Main Topics  . Smoking status: Never Smoker   . Smokeless tobacco: Never Used  . Alcohol Use: No  . Drug Use: No  . Sexually Active: None   Other Topics Concern  . None   Social History Narrative  . None   Family History  Problem Relation Age of Onset  . Heart disease Paternal  Grandfather   . Heart disease    . Diabetes Mother   . Diabetes Father   . Colon cancer Neg Hx        Current Medications, Allergies, Past Medical History, Past Surgical History, Family History and Social History were reviewed in [employer] record.  Physical Exam: I cannot appreciate stigmata of chronic liver disease. Blood pressure 124/80 and pulse 72 and regular. He is 6 feet 5 inches tall weighs 292 pounds. BMI 34.63. Chest is clear and cardiac exam is unremarkable. I cannot appreciate hepatosplenomegaly, abdominal masses, or tenderness. Bowel sounds are normal. Peripheral extremities are unremarkable and mental status is normal.  Assessment and plan: Chronic GERD with probable recurrent peptic stricture the esophagus, and the patient has an esophageal motility disorder and Barrett's mucosa. I have scheduled him for followup endoscopy and since he is near age 65. Labs also ordered for review including liver function tests. Encounter Diagnoses  Name Primary?  . Special screening for malignant neoplasms, colon Yes  . Dysphagia

## 2011-07-01 ENCOUNTER — Encounter: Payer: Self-pay | Admitting: Gastroenterology

## 2011-07-01 ENCOUNTER — Ambulatory Visit (AMBULATORY_SURGERY_CENTER): Payer: 59 | Admitting: Gastroenterology

## 2011-07-01 VITALS — BP 123/77 | HR 82 | Temp 95.7°F | Resp 14 | Ht 77.0 in | Wt 292.0 lb

## 2011-07-01 DIAGNOSIS — K219 Gastro-esophageal reflux disease without esophagitis: Secondary | ICD-10-CM

## 2011-07-01 DIAGNOSIS — K209 Esophagitis, unspecified without bleeding: Secondary | ICD-10-CM

## 2011-07-01 DIAGNOSIS — K317 Polyp of stomach and duodenum: Secondary | ICD-10-CM

## 2011-07-01 DIAGNOSIS — D131 Benign neoplasm of stomach: Secondary | ICD-10-CM

## 2011-07-01 DIAGNOSIS — R1310 Dysphagia, unspecified: Secondary | ICD-10-CM

## 2011-07-01 DIAGNOSIS — Z1211 Encounter for screening for malignant neoplasm of colon: Secondary | ICD-10-CM

## 2011-07-01 DIAGNOSIS — R079 Chest pain, unspecified: Secondary | ICD-10-CM

## 2011-07-01 DIAGNOSIS — K2270 Barrett's esophagus without dysplasia: Secondary | ICD-10-CM

## 2011-07-01 MED ORDER — SODIUM CHLORIDE 0.9 % IV SOLN: 500.0000 mL | INTRAVENOUS | Status: DC

## 2011-07-01 NOTE — Op Note (Signed)
[facility] [address] [place], [place]  [address]  COLONOSCOPY PROCEDURE REPORT  PATIENT:  Timothy Dixon, Timothy Dixon  MR#:  [id number] BIRTHDATE:  1960-05-18, 50 yrs. old  GENDER:  male ENDOSCOPIST:  Vania Rea. Jarold Motto, MD, [facility] REF. BY: PROCEDURE DATE:  07/01/2011 PROCEDURE:  Average-risk screening colonoscopy G0121 ASA CLASS:  Class II INDICATIONS:  Routine Risk Screening MEDICATIONS:   propofol (Diprivan) 250 mg IV  DESCRIPTION OF PROCEDURE:   After the risks and benefits and of the procedure were explained, informed consent was obtained. Digital rectal exam was performed and revealed no abnormalities. The LB CF-H180AL [id number] endoscope was introduced through the anus and advanced to the cecum, which was identified by both the appendix and ileocecal valve.  The quality of the prep was poor, using MoviPrep.  The instrument was then slowly withdrawn as the colon was fully examined. <<PROCEDUREIMAGES>>  FINDINGS:  No polyps or cancers were seen.  This was otherwise a normal examination of the colon.  The prep was not adequate to allow appropriate inspection of the mucosa. .   Retroflexed views in the rectum revealed no abnormalities. The scope was then withdrawn from the patient and the procedure completed.  COMPLICATIONS:  None ENDOSCOPIC IMPRESSION: 1) No polyps or cancers 2) Otherwise normal examination 3) Poor prep RECOMMENDATIONS: 1) Repeat Colonoscopy in 5 years. "DOUBLE PREP" NEEDED.  REPEAT EXAM:  No  ______________________________ Vania Rea. Jarold Motto, MD, [facility]  CC:  n. eSIGNED:   Vania Rea. Normand Damron at 07/01/2011 03:18 PM  Miquel Dunn, [phone]

## 2011-07-01 NOTE — Progress Notes (Signed)
Patient did not experience any of the following events: a burn prior to discharge; a fall within the facility; wrong site/side/patient/procedure/implant event; or a hospital transfer or hospital admission upon discharge from the facility. (G8907) Patient did not have preoperative order for IV antibiotic SSI prophylaxis. (G8918)  

## 2011-07-01 NOTE — Op Note (Signed)
[facility] [address] [place], [place]  [address]  ENDOSCOPY PROCEDURE REPORT  PATIENT:  Timothy Dixon, Timothy Dixon  MR#:  [id number] BIRTHDATE:  1960/02/23, 50 yrs. old  GENDER:  male  ENDOSCOPIST:  Vania Rea. Jarold Motto, MD, [facility] Referred by:  PROCEDURE DATE:  07/01/2011 PROCEDURE:  EGD with biopsy, 19147 ASA CLASS:  Class II INDICATIONS:  GERD, h/o Barrett's Esophagus  MEDICATIONS:   There was residual sedation effect present from prior procedure., propofol (Diprivan) 150 mg IV TOPICAL ANESTHETIC:  DESCRIPTION OF PROCEDURE:   After the risks and benefits of the procedure were explained, informed consent was obtained.  The LB GIF-H180 [id number] endoscope was introduced through the mouth and advanced to the second portion of the duodenum.  The instrument was slowly withdrawn as the mucosa was fully examined. <<PROCEDUREIMAGES>>  A hiatal hernia was found. 3-4 CM. HH NOTED.  Barrett's esophagus was found. SHORT PATCHES OF BARRETT'S MUCOSA BIOPSIED,NO STRICTURE BNOTED.  There were multiple polyps identified. LARGE HYPERPLASTIC GASTRIC POLYPS BIOPSIED.SEE [ICTURES.  Otherwise normal esophagus. Retroflexed views revealed a hiatal hernia.    The scope was then withdrawn from the patient and the procedure completed.  COMPLICATIONS:  None  ENDOSCOPIC IMPRESSION: 1) Hiatal hernia 2) Barrett's esophagus 3) Polyps, multiple 4) Otherwise normal esophagus 5) A hiatal hernia 1.CHRONIC GERD AND STABLE BARRETT'S.R/O DYSPLASIA 2.HYPERPLASTIC FUNDAL POLYPS RECOMMENDATIONS: 1) Await biopsy results 2) continue PPI  ______________________________ Vania Rea. Jarold Motto, MD, [facility]  CC:  n. eSIGNED:   Vania Rea. Carry Ortez at 07/01/2011 03:29 PM  Miquel Dunn, [phone]

## 2011-07-01 NOTE — Patient Instructions (Signed)
Discharge instructions given with verbal understanding. Handouts on hiatal hernia,and barretts given. Resume previous medications.YOU HAD AN ENDOSCOPIC PROCEDURE TODAY AT THE [facility]: Refer to the procedure report that was given to you for any specific questions about what was found during the examination.  If the procedure report does not answer your questions, please call your gastroenterologist to clarify.  If you requested that your care partner not be given the details of your procedure findings, then the procedure report has been included in a sealed envelope for you to review at your convenience later.  YOU SHOULD EXPECT: Some feelings of bloating in the abdomen. Passage of more gas than usual.  Walking can help get rid of the air that was put into your GI tract during the procedure and reduce the bloating. If you had a lower endoscopy (such as a colonoscopy or flexible sigmoidoscopy) you may notice spotting of blood in your stool or on the toilet paper. If you underwent a bowel prep for your procedure, then you may not have a normal bowel movement for a few days.  DIET: Your first meal following the procedure should be a light meal and then it is ok to progress to your normal diet.  A half-sandwich or bowl of soup is an example of a good first meal.  Heavy or fried foods are harder to digest and may make you feel nauseous or bloated.  Likewise meals heavy in dairy and vegetables can cause extra gas to form and this can also increase the bloating.  Drink plenty of fluids but you should avoid alcoholic beverages for 24 hours.  ACTIVITY: Your care partner should take you home directly after the procedure.  You should plan to take it easy, moving slowly for the rest of the day.  You can resume normal activity the day after the procedure however you should NOT DRIVE or use heavy machinery for 24 hours (because of the sedation medicines used during the test).    SYMPTOMS TO REPORT  IMMEDIATELY: A gastroenterologist can be reached at any hour.  During normal business hours, 8:30 AM to 5:00 PM Monday through Friday, call [phone].  After hours and on weekends, please call the GI answering service at [phone] who will take a message and have the physician on call contact you.   Following lower endoscopy (colonoscopy or flexible sigmoidoscopy):  Excessive amounts of blood in the stool  Significant tenderness or worsening of abdominal pains  Swelling of the abdomen that is new, acute  Fever of 100F or higher  Following upper endoscopy (EGD)  Vomiting of blood or coffee ground material  New chest pain or pain under the shoulder blades  Painful or persistently difficult swallowing  New shortness of breath  Fever of 100F or higher  Black, tarry-looking stools  FOLLOW UP: If any biopsies were taken you will be contacted by phone or by letter within the next 1-3 weeks.  Call your gastroenterologist if you have not heard about the biopsies in 3 weeks.  Our staff will call the home number listed on your records the next business day following your procedure to check on you and address any questions or concerns that you may have at that time regarding the information given to you following your procedure. This is a courtesy call and so if there is no answer at the home number and we have not heard from you through the emergency physician on call, we will assume that you have  have returned to your regular daily activities without incident.  SIGNATURES/CONFIDENTIALITY: You and/or your care partner have signed paperwork which will be entered into your electronic medical record.  These signatures attest to the fact that that the information above on your After Visit Summary has been reviewed and is understood.  Full responsibility of the confidentiality of this discharge information lies with you and/or your care-partner.  

## 2011-07-02 ENCOUNTER — Telehealth: Payer: Self-pay | Admitting: *Deleted

## 2011-07-02 NOTE — Telephone Encounter (Signed)
  Follow up Call-  Call back number 07/01/2011  Post procedure Call Back phone  # [phone]  Permission to leave phone message Yes     Patient questions:  Do you have a fever, pain , or abdominal swelling? no Pain Score  0 *  Have you tolerated food without any problems? yes  Have you been able to return to your normal activities? yes  Do you have any questions about your discharge instructions: Diet   no Medications  no Follow up visit  no  Do you have questions or concerns about your Care? no  Actions: * If pain score is 4 or above: No action needed, pain <4.

## 2011-07-03 ENCOUNTER — Encounter: Payer: Self-pay | Admitting: Gastroenterology

## 2011-07-07 ENCOUNTER — Encounter: Payer: Self-pay | Admitting: Gastroenterology

## 2011-07-21 ENCOUNTER — Encounter: Payer: Self-pay | Admitting: Gastroenterology

## 2011-08-29 ENCOUNTER — Other Ambulatory Visit: Payer: Self-pay | Admitting: Gastroenterology

## 2011-09-18 ENCOUNTER — Telehealth: Payer: Self-pay | Admitting: Gastroenterology

## 2011-09-18 MED ORDER — RABEPRAZOLE SODIUM 20 MG PO TBEC: 20.0000 mg | DELAYED_RELEASE_TABLET | Freq: Two times a day (BID) | ORAL | Status: DC

## 2011-09-18 NOTE — Telephone Encounter (Signed)
Pt needed refills for the next 6 months of Aciphex; ordered.

## 2011-09-18 NOTE — Telephone Encounter (Signed)
lmom for pt to call back

## 2012-08-23 ENCOUNTER — Ambulatory Visit: Payer: Self-pay | Admitting: Gastroenterology

## 2012-09-08 ENCOUNTER — Encounter: Payer: Self-pay | Admitting: Gastroenterology

## 2012-09-08 ENCOUNTER — Ambulatory Visit (INDEPENDENT_AMBULATORY_CARE_PROVIDER_SITE_OTHER): Payer: 59 | Admitting: Gastroenterology

## 2012-09-08 VITALS — BP 100/70 | HR 82 | Ht 77.0 in | Wt 281.4 lb

## 2012-09-08 DIAGNOSIS — K219 Gastro-esophageal reflux disease without esophagitis: Secondary | ICD-10-CM

## 2012-09-08 DIAGNOSIS — R0989 Other specified symptoms and signs involving the circulatory and respiratory systems: Secondary | ICD-10-CM

## 2012-09-08 DIAGNOSIS — F458 Other somatoform disorders: Secondary | ICD-10-CM

## 2012-09-08 DIAGNOSIS — R079 Chest pain, unspecified: Secondary | ICD-10-CM

## 2012-09-08 DIAGNOSIS — K317 Polyp of stomach and duodenum: Secondary | ICD-10-CM

## 2012-09-08 DIAGNOSIS — R198 Other specified symptoms and signs involving the digestive system and abdomen: Secondary | ICD-10-CM

## 2012-09-08 DIAGNOSIS — D131 Benign neoplasm of stomach: Secondary | ICD-10-CM

## 2012-09-08 DIAGNOSIS — R1319 Other dysphagia: Secondary | ICD-10-CM

## 2012-09-08 MED ORDER — RABEPRAZOLE SODIUM 20 MG PO TBEC: 20.0000 mg | DELAYED_RELEASE_TABLET | Freq: Two times a day (BID) | ORAL | Status: DC

## 2012-09-08 MED ORDER — SUCRALFATE 1 GM/10ML PO SUSP: 1.0000 g | Freq: Four times a day (QID) | ORAL | Status: DC | PRN

## 2012-09-08 NOTE — Patient Instructions (Addendum)
You have been scheduled for an endoscopy with propofol. Please follow written instructions given to you at your visit today. If you use inhalers (even only as needed), please bring them with you on the day of your procedure. Your physician has requested that you go to www.startemmi.com and enter the access code given to you at your visit today. This web site gives a general overview about your procedure. However, you should still follow specific instructions given to you by our office regarding your preparation for the procedure.  You have been scheduled for a Barium Esophogram at [facility] (1st floor of the hospital) on Wednesday, 09/14/12 at 11:00 am. Please arrive 15 minutes prior to your appointment for registration. Make certain not to have anything to eat or drink 6 hours prior to your test. If you need to reschedule for any reason, please contact radiology at [phone] to do so. __________________________________________________________________ A barium swallow is an examination that concentrates on views of the esophagus. This tends to be a double contrast exam (barium and two liquids which, when combined, create a gas to distend the wall of the oesophagus) or single contrast (non-ionic iodine based). The study is usually tailored to your symptoms so a good history is essential. Attention is paid during the study to the form, structure and configuration of the esophagus, looking for functional disorders (such as aspiration, dysphagia, achalasia, motility and reflux) EXAMINATION You may be asked to change into a gown, depending on the type of swallow being performed. A radiologist and radiographer will perform the procedure. The radiologist will advise you of the type of contrast selected for your procedure and direct you during the exam. You will be asked to stand, sit or lie in several different positions and to hold a small amount of fluid in your mouth before being asked to swallow  while the imaging is performed .In some instances you may be asked to swallow barium coated marshmallows to assess the motility of a solid food bolus. The exam can be recorded as a digital or video fluoroscopy procedure. POST PROCEDURE It will take 1-2 days for the barium to pass through your system. To facilitate this, it is important, unless otherwise directed, to increase your fluids for the next 24-48hrs and to resume your normal diet.  This test typically takes about 30 minutes to perform. __________________________________________________________________________________  Timothy Dixon have been scheduled for an esophageal manometry at [facility] on 09/26/12 at 11:00 am. Please arrive 30 minutes prior to your procedure for registration. You will need to go to outpatient registration (1st floor of the hospital) first. Make certain to bring your insurance cards as well as a complete list of medications.  Please remember the following:  1) Nothing to eat or drink after 12:00 midnight on the night before your test.  2) Hold all diabetic medications/insulin the morning of the test. You may eat and take your medications after the test.  3) For 3 days prior to your test do not take: Dexilant, Prevacid, Nexium, Protonix, Aciphex, Zegerid, Pantoprazole, Prilosec or omeprazole.  4) For 2 days prior to your test, do not take: Reglan, Tagamet, Zantac, Axid or Pepcid.  5) You MAY use an antacid such as Rolaids or Tums up to 12 hours prior to your test.  It will take at least 2 weeks to receive the results of this test from your physician. ------------------------------------------ ABOUT ESOPHAGEAL MANOMETRY Esophageal manometry (muh-NOM-uh-tree) is a test that gauges how well your esophagus works. Your esophagus  is the long, muscular tube that connects your throat to your stomach. Esophageal manometry measures the rhythmic muscle contractions (peristalsis) that occur in your esophagus when you  swallow. Esophageal manometry also measures the coordination and force exerted by the muscles of your esophagus.  During esophageal manometry, a thin, flexible tube (catheter) that contains sensors is passed through your nose, down your esophagus and into your stomach. Esophageal manometry can be helpful in diagnosing some mostly uncommon disorders that affect your esophagus.  Why it's done Esophageal manometry is used to evaluate the movement (motility) of food through the esophagus and into the stomach. The test measures how well the circular bands of muscle (sphincters) at the top and bottom of your esophagus open and close, as well as the pressure, strength and pattern of the wave of esophageal muscle contractions that moves food along.  What you can expect Esophageal manometry is an outpatient procedure done without sedation. Most people tolerate it well. You may be asked to change into a hospital gown before the test starts.  During esophageal manometry  While you are sitting up, a member of your health care team sprays your throat with a numbing medication or puts numbing gel in your nose or both.  A catheter is guided through your nose into your esophagus. The catheter may be sheathed in a water-filled sleeve. It doesn't interfere with your breathing. However, your eyes may water, and you may gag. You may have a slight nosebleed from irritation.  After the catheter is in place, you may be asked to lie on your back on an exam table, or you may be asked to remain seated.  You then swallow small sips of water. As you do, a computer connected to the catheter records the pressure, strength and pattern of your esophageal muscle contractions.  During the test, you'll be asked to breathe slowly and smoothly, remain as still as possible, and swallow only when you're asked to do so.  A member of your health care team may move the catheter down into your stomach while the catheter continues its  measurements.  The catheter then is slowly withdrawn. The test usually lasts 20 to 30 minutes.  After esophageal manometry  When your esophageal manometry is complete, you may return to your normal activities  This test typically takes 30-45 minutes to complete. ________________________________________________________________________________  We have sent the following medications to your pharmacy for you to pick up at your convenience: Carafate Aciphex

## 2012-09-08 NOTE — Progress Notes (Signed)
This is a 52 year old Caucasian male who has had over 10 years of acid reflux usually managed well with twice a day AcipHex 20 mg.  I dilated him endoscopically in may of 2013.  He has a prominent hiatal hernia and large gastric polyps.  Biopsies were positive for Barrett's mucosa, he had an esophageal stricture that was dilated over a 5 cm hiatal hernia . Over the last several weeks he's had recurrence of solid food dysphagia, globus sensation, and a feeling of a fullness in the subxiphoid area.  He denies any hepatobiliary or lower gastrointestinal issues.  Previous esophageal manometry over 10 years ago showed peristalsis, but low amplitude contractions.  He denies symptoms of Raynaud's phenomenon or collagen vascular disease. He did have an episode of 4 days of diarrhea when his problems began, but he no longer has diarrhea, and last year had a negative colonoscopy.  He denies any systemic complaints.  Current Medications, Allergies, Past Medical History, Past Surgical History, Family History and Social History were reviewed in [employer] record.  ROS: All systems were reviewed and are negative unless otherwise stated in the HPI.          Physical Exam: Blood pressure 100/70, pulse 82 and regular, and weight 281 with a BMI of 33.36.  I cannot appreciate thyromegaly or lymphadenopathy.  Chest is clear and he is in a regular rhythm without murmurs gallops or rubs.  Abdominal exam shows no organomegaly, masses or tenderness.  Bowel sounds are normal.  Mental status is normal.    Assessment and Plan: Probable recurrent peptic stricture of his esophagus with associated esophageal dysmotility.  I am going to go forward full blast and really work up his esophageal problems.  I have ordered a barium swallow to be followed by endoscopy and probable repeat high-resolution esophageal manometry.  Depending on the results of these tests, he may need fundoplication surgery by Dr. Luretha Murphy.  We will continue his twice a day PPI therapy with when necessary Carafate suspension in the interim. Encounter Diagnosis  Name Primary?  . Other dysphagia Yes

## 2012-09-09 ENCOUNTER — Encounter: Payer: Self-pay | Admitting: Gastroenterology

## 2012-09-09 ENCOUNTER — Ambulatory Visit (AMBULATORY_SURGERY_CENTER): Payer: 59 | Admitting: Gastroenterology

## 2012-09-09 VITALS — BP 145/82 | HR 75 | Temp 97.5°F | Resp 32 | Ht 77.0 in | Wt 281.0 lb

## 2012-09-09 DIAGNOSIS — K209 Esophagitis, unspecified without bleeding: Secondary | ICD-10-CM

## 2012-09-09 DIAGNOSIS — R1310 Dysphagia, unspecified: Secondary | ICD-10-CM

## 2012-09-09 DIAGNOSIS — K317 Polyp of stomach and duodenum: Secondary | ICD-10-CM

## 2012-09-09 DIAGNOSIS — K2270 Barrett's esophagus without dysplasia: Secondary | ICD-10-CM

## 2012-09-09 DIAGNOSIS — R1319 Other dysphagia: Secondary | ICD-10-CM

## 2012-09-09 DIAGNOSIS — K219 Gastro-esophageal reflux disease without esophagitis: Secondary | ICD-10-CM

## 2012-09-09 DIAGNOSIS — D131 Benign neoplasm of stomach: Secondary | ICD-10-CM

## 2012-09-09 MED ORDER — SODIUM CHLORIDE 0.9 % IV SOLN: 500.0000 mL | INTRAVENOUS | Status: DC

## 2012-09-09 NOTE — Op Note (Signed)
[facility] [address] [place], [address]   ENDOSCOPY PROCEDURE REPORT  PATIENT: Timothy Dixon, Timothy Dixon  MR#: [id number] BIRTHDATE: 07/18/1960 , 51  yrs. old GENDER: Male ENDOSCOPIST:Mizuki Hoel Hale Bogus, MD, [facility] REFERRED BY: PROCEDURE DATE:  09/09/2012 PROCEDURE:   EGD w/ biopsy and Maloney dilation of esophagus ASA CLASS:    Class II INDICATIONS: Dysphagia and History of reflux esophagitis. MEDICATION: Propofol (Diprivan) 230 mg IV TOPICAL ANESTHETIC:   Cetacaine Spray  DESCRIPTION OF PROCEDURE:   After the risks and benefits of the procedure were explained, informed consent was obtained.  The LB [id number]  endoscope was introduced through the mouth  and advanced to the second portion of the duodenum .  The instrument was slowly withdrawn as the mucosa was fully examined.      DUODENUM: The duodenal mucosa showed no abnormalities in the bulb and second portion of the duodenum.  STOMACH: Multiple large pedunculated polyps were found.  Multiple enlarged vascular gastric polyps again noted but are not bleeding. Biopsies were not obtained at this endoscopy.  ESOPHAGUS: Narrowband imaging showed possible small area of Barrett's mucosa which was biopsied.  There is a rather prominent hiatal hernia noted without any definite stricture.  The patient was dilated with a #54 [place] dilator without difficulty. Narrowband imaging showed possible small area of Barrett's mucosa which was biopsied.  There is a rather prominent hiatal hernia noted without any definite stricture.  The patient was dilated with a #54 [place] dilator without difficulty.    Retroflexed views revealed a hiatal hernia.    The scope was then withdrawn from the patient and the procedure completed.  COMPLICATIONS: There were no complications.   ENDOSCOPIC IMPRESSION: 1.   The duodenal mucosa showed no abnormalities in the bulb and second portion of the duodenum 2.    Multiple large pedunculated polyps were found in stomach from PPI use 3.   Narrowband imaging showed possible small area of Barrett's mucosa in distal esophagus which was biopsied.  There is a rather prominent hiatal hernia noted without any definite stricture.  The patient was dilated with a #54 [place] dilator without difficulty.   RECOMMENDATIONS: This patient is scheduled for barium swallow to assess the size of his hiatal hernia,and possible surgical intervention is being considered..  Esophageal manometry also has been scheduled. Additionally I will check outpatient serum gastrin level per his gastric polyposis.  We'll see how he does with his dysphagia after his dilation.  Biopsy exam for Barrett's mucosa also pending.    _______________________________ eSigned:  Mardella Layman, MD, [facility] 09/09/2012 4:13 PM   standard discharge   PATIENT NAME:  Timothy Dixon, Timothy Dixon MR#: [id number]

## 2012-09-09 NOTE — Progress Notes (Signed)
Patient did not experience any of the following events: a burn prior to discharge; a fall within the facility; wrong site/side/patient/procedure/implant event; or a hospital transfer or hospital admission upon discharge from the facility. (G8907) Patient did not have preoperative order for IV antibiotic SSI prophylaxis. (G8918)  

## 2012-09-09 NOTE — Progress Notes (Signed)
Called to room to assist during endoscopic procedure.  Patient ID and intended procedure confirmed with present staff. Received instructions for my participation in the procedure from the performing physician.  

## 2012-09-09 NOTE — Progress Notes (Signed)
Procedure ends, to recovery, report given and VSS. 

## 2012-09-09 NOTE — Patient Instructions (Addendum)
Discharge instructions given with verbal understanding. Biopsies taken. Handouts on a hiatal hernia and a dilatation diet given. Resume previous medications. YOU HAD AN ENDOSCOPIC PROCEDURE TODAY AT THE [facility]: Refer to the procedure report that was given to you for any specific questions about what was found during the examination.  If the procedure report does not answer your questions, please call your gastroenterologist to clarify.  If you requested that your care partner not be given the details of your procedure findings, then the procedure report has been included in a sealed envelope for you to review at your convenience later.  YOU SHOULD EXPECT: Some feelings of bloating in the abdomen. Passage of more gas than usual.  Walking can help get rid of the air that was put into your GI tract during the procedure and reduce the bloating. If you had a lower endoscopy (such as a colonoscopy or flexible sigmoidoscopy) you may notice spotting of blood in your stool or on the toilet paper. If you underwent a bowel prep for your procedure, then you may not have a normal bowel movement for a few days.  DIET: Your first meal following the procedure should be a light meal and then it is ok to progress to your normal diet.  A half-sandwich or bowl of soup is an example of a good first meal.  Heavy or fried foods are harder to digest and may make you feel nauseous or bloated.  Likewise meals heavy in dairy and vegetables can cause extra gas to form and this can also increase the bloating.  Drink plenty of fluids but you should avoid alcoholic beverages for 24 hours.  ACTIVITY: Your care partner should take you home directly after the procedure.  You should plan to take it easy, moving slowly for the rest of the day.  You can resume normal activity the day after the procedure however you should NOT DRIVE or use heavy machinery for 24 hours (because of the sedation medicines used during the test).     SYMPTOMS TO REPORT IMMEDIATELY: A gastroenterologist can be reached at any hour.  During normal business hours, 8:30 AM to 5:00 PM Monday through Friday, call [phone].  After hours and on weekends, please call the GI answering service at [phone] who will take a message and have the physician on call contact you.   Following upper endoscopy (EGD)  Vomiting of blood or coffee ground material  New chest pain or pain under the shoulder blades  Painful or persistently difficult swallowing  New shortness of breath  Fever of 100F or higher  Black, tarry-looking stools  FOLLOW UP: If any biopsies were taken you will be contacted by phone or by letter within the next 1-3 weeks.  Call your gastroenterologist if you have not heard about the biopsies in 3 weeks.  Our staff will call the home number listed on your records the next business day following your procedure to check on you and address any questions or concerns that you may have at that time regarding the information given to you following your procedure. This is a courtesy call and so if there is no answer at the home number and we have not heard from you through the emergency physician on call, we will assume that you have returned to your regular daily activities without incident.  SIGNATURES/CONFIDENTIALITY: You and/or your care partner have signed paperwork which will be entered into your electronic medical record.  These signatures attest to  the fact that that the information above on your After Visit Summary has been reviewed and is understood.  Full responsibility of the confidentiality of this discharge information lies with you and/or your care-partner.

## 2012-09-09 NOTE — Progress Notes (Signed)
NO EGG OR SOY ALLERGY. EMW 

## 2012-09-12 ENCOUNTER — Telehealth: Payer: Self-pay

## 2012-09-12 NOTE — Telephone Encounter (Signed)
Left message on answering machine. 

## 2012-09-14 ENCOUNTER — Ambulatory Visit (HOSPITAL_COMMUNITY)
Admission: RE | Admit: 2012-09-14 | Discharge: 2012-09-14 | Disposition: A | Discharge status: Home / Self Care | Payer: 59 | Source: Ambulatory Visit | Attending: Gastroenterology | Admitting: Gastroenterology

## 2012-09-14 DIAGNOSIS — K449 Diaphragmatic hernia without obstruction or gangrene: Secondary | ICD-10-CM | POA: Insufficient documentation

## 2012-09-14 DIAGNOSIS — K2270 Barrett's esophagus without dysplasia: Secondary | ICD-10-CM | POA: Insufficient documentation

## 2012-09-14 DIAGNOSIS — K219 Gastro-esophageal reflux disease without esophagitis: Secondary | ICD-10-CM | POA: Insufficient documentation

## 2012-09-14 DIAGNOSIS — R1319 Other dysphagia: Secondary | ICD-10-CM

## 2012-09-16 ENCOUNTER — Encounter: Payer: Self-pay | Admitting: Gastroenterology

## 2012-09-23 ENCOUNTER — Other Ambulatory Visit: Payer: Self-pay

## 2012-09-23 ENCOUNTER — Telehealth: Payer: Self-pay

## 2012-09-23 DIAGNOSIS — R1013 Epigastric pain: Secondary | ICD-10-CM

## 2012-09-23 NOTE — Telephone Encounter (Signed)
Pt needs to have a serum gastrin level drawn per Dr. Norval Gable procedure report. Called pt and left him a message to call back. Order in epic.

## 2012-09-26 ENCOUNTER — Ambulatory Visit: Payer: 59

## 2012-09-26 ENCOUNTER — Ambulatory Visit (HOSPITAL_COMMUNITY)
Admission: RE | Admit: 2012-09-26 | Discharge: 2012-09-26 | Disposition: A | Discharge status: Home / Self Care | Payer: 59 | Source: Ambulatory Visit | Attending: Gastroenterology | Admitting: Gastroenterology

## 2012-09-26 ENCOUNTER — Encounter (HOSPITAL_COMMUNITY)
Admission: RE | Disposition: A | Discharge status: Home / Self Care | Payer: Self-pay | Source: Ambulatory Visit | Attending: Gastroenterology

## 2012-09-26 DIAGNOSIS — R1013 Epigastric pain: Secondary | ICD-10-CM

## 2012-09-26 DIAGNOSIS — R1310 Dysphagia, unspecified: Secondary | ICD-10-CM | POA: Insufficient documentation

## 2012-09-26 DIAGNOSIS — K219 Gastro-esophageal reflux disease without esophagitis: Secondary | ICD-10-CM | POA: Insufficient documentation

## 2012-09-26 HISTORY — PX: ESOPHAGEAL MANOMETRY: SHX5429

## 2012-09-26 SURGERY — MANOMETRY, ESOPHAGUS
Anesthesia: Topical

## 2012-09-26 MED ORDER — LIDOCAINE VISCOUS 2 % MT SOLN
OROMUCOSAL | Status: AC
  Filled 2012-09-26: qty 15

## 2012-09-26 SURGICAL SUPPLY — 4 items
DRAPE UTILITY 15X26 W/TAPE STR (DRAPE) ×2 IMPLANT
GLOVE BIOGEL PI IND STRL 8 (GLOVE) ×1 IMPLANT
GLOVE BIOGEL PI INDICATOR 8 (GLOVE) ×1
GOWN PREVENTION PLUS LG XLONG (DISPOSABLE) ×2 IMPLANT

## 2012-09-26 NOTE — Telephone Encounter (Signed)
Pt aware.

## 2012-09-27 ENCOUNTER — Encounter (HOSPITAL_COMMUNITY): Payer: Self-pay | Admitting: Gastroenterology

## 2012-09-30 ENCOUNTER — Telehealth: Payer: Self-pay | Admitting: *Deleted

## 2012-09-30 MED ORDER — METOCLOPRAMIDE HCL 10 MG PO TABS: 10.0000 mg | ORAL_TABLET | Freq: Every day | ORAL | Status: DC

## 2012-09-30 NOTE — Telephone Encounter (Signed)
The patient has been notified of this information and all questions answered. He will f/u with Dr Jarold Motto on 10/20/12.

## 2012-09-30 NOTE — Telephone Encounter (Signed)
lmom for pt to call back. Per Dr Jarold Motto, the EM shows the same info as before; weak peristalsis. We can order Reglan 10 mg QHS and he would like to see him in 2-3 weeks.

## 2012-10-20 ENCOUNTER — Encounter: Payer: Self-pay | Admitting: Gastroenterology

## 2012-10-20 ENCOUNTER — Ambulatory Visit (INDEPENDENT_AMBULATORY_CARE_PROVIDER_SITE_OTHER): Payer: 59 | Admitting: Gastroenterology

## 2012-10-20 VITALS — BP 124/76 | HR 82 | Ht 77.75 in | Wt 290.2 lb

## 2012-10-20 DIAGNOSIS — K224 Dyskinesia of esophagus: Secondary | ICD-10-CM

## 2012-10-20 DIAGNOSIS — K219 Gastro-esophageal reflux disease without esophagitis: Secondary | ICD-10-CM

## 2012-10-20 DIAGNOSIS — R079 Chest pain, unspecified: Secondary | ICD-10-CM

## 2012-10-20 MED ORDER — METOCLOPRAMIDE HCL 10 MG PO TABS: 10.0000 mg | ORAL_TABLET | Freq: Every day | ORAL | Status: DC
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
## 2012-10-20 NOTE — Patient Instructions (Signed)
Please follow up with Dr. Jarold Motto in six months

## 2012-12-22 ENCOUNTER — Other Ambulatory Visit: Payer: Self-pay

## 2013-03-02 ENCOUNTER — Encounter: Payer: Self-pay | Admitting: General Surgery

## 2013-03-02 DIAGNOSIS — G4733 Obstructive sleep apnea (adult) (pediatric): Secondary | ICD-10-CM

## 2013-03-11 ENCOUNTER — Other Ambulatory Visit: Payer: Self-pay | Admitting: Gastroenterology

## 2013-03-16 ENCOUNTER — Ambulatory Visit (INDEPENDENT_AMBULATORY_CARE_PROVIDER_SITE_OTHER): Payer: 59 | Admitting: Cardiology

## 2013-03-16 ENCOUNTER — Encounter: Payer: Self-pay | Admitting: Cardiology

## 2013-03-16 ENCOUNTER — Encounter: Payer: Self-pay | Admitting: General Surgery

## 2013-03-16 VITALS — BP 120/88 | HR 88 | Ht 77.0 in | Wt 284.1 lb

## 2013-03-16 DIAGNOSIS — G4733 Obstructive sleep apnea (adult) (pediatric): Secondary | ICD-10-CM

## 2013-03-16 DIAGNOSIS — E669 Obesity, unspecified: Secondary | ICD-10-CM

## 2013-03-16 DIAGNOSIS — R0789 Other chest pain: Secondary | ICD-10-CM

## 2013-03-16 NOTE — Progress Notes (Signed)
[place], [place], [place]  [address] Phone: [phone] Fax:  [phone]  Date:  03/16/2013   ID:  Timothy, Dixon Jul 09, 1960, MRN [id number]  PCP:  London Pepper, MD  Sleep Medicine:  Fransico Him, MD  History of Present Illness: Timothy Dixon is a 53 y.o. male with a history of OSA and obesity who presents today for evaluation of OSA.    He has not been seen by a sleep medicine MD in about 3 years.  He is doing well with his CPAP therapy.  He tolerates the device without any problems.  He tolerates the mask.  Sometimes he feels the pressure is not enough.  He feels rested in the am and has no daytime sleepiness.  He recently found to have elevated cholesterol and has an appt with a nutritionist and has been watching what he eats.  He has lost 8 pounds but has not started any aerobic exercise.  He was placed on cholesterol lowering drugs.  He is concerned that he occasionally has some sharp pains in his chest that are similar to what he had a few years ago.  He occasionally has some DOE when walking but hiked on the [place] trail last fall carrying 50 pounds and has no problems.     Wt Readings from Last 3 Encounters:  03/16/13 284 lb 1.9 oz (128.876 kg)  10/20/12 290 lb 4 oz (131.657 kg)  09/09/12 281 lb (127.461 kg)     Past Medical History  Diagnosis Date  . Hypercholesterolemia   . Sleep apnea   . Esophageal reflux   . Hiatal hernia   . Barrett esophagus   . Fatty liver   . Gastric polyp   . OSA (obstructive sleep apnea)   . GERD (gastroesophageal reflux disease)   . Anxiety     Current Outpatient Prescriptions  Medication Sig Dispense Refill  . atorvastatin (LIPITOR) 20 MG tablet Take 20 mg by mouth daily at 6 PM.       . busPIRone (BUSPAR) 10 MG tablet Take 10 mg by mouth 2 (two) times daily.       . metoCLOPramide (REGLAN) 10 MG tablet Take 1 tablet (10 mg total) by mouth at bedtime.  30 tablet  6  . RABEprazole (ACIPHEX) 20 MG  tablet Take 20 mg by mouth 2 (two) times daily.       . vitamin B-12 (CYANOCOBALAMIN) 1000 MCG tablet Take 1,000 mcg by mouth daily.       No current facility-administered medications for this visit.    Allergies:   No Known Allergies  Social History:  The patient  reports that he has never smoked. He has never used smokeless tobacco. He reports that he drinks alcohol. He reports that he does not use illicit drugs.   Family History:  The patient's family history includes Diabetes in his father and mother; Heart disease in his paternal grandfather and another family member; Hypertension in his brother. There is no history of Colon cancer.   ROS:  Please see the history of present illness.      All other systems reviewed and negative.   PHYSICAL EXAM: VS:  BP 120/88  Pulse 88  Ht 6\' 5"  (1.956 m)  Wt 284 lb 1.9 oz (128.876 kg)  BMI 33.68 kg/m2 Well nourished, well developed, in no acute distress HEENT: normal Neck: no JVD Cardiac:  normal S1, S2; RRR; no murmur Lungs:  clear to auscultation bilaterally, no  wheezing, rhonchi or rales Abd: soft, nontender, no hepatomegaly Ext: no edema Skin: warm and dry Neuro:  CNs 2-12 intact, no focal abnormalities noted    EKG:  NSR with no ST changes   ASSESSMENT AND PLAN: 1.   Obesity  - I stressed the importance of getting into an exercise regimen along with his diet 2.   OSA on CPAP and tolerating well  -  I will get a download from his DME 3.   Atypical Chest pain  - he had a normal cath several years ago but he is concerned that he may have blockages since he found out he has high cholesterol.  I will get an ETT    Followup with me in 6 months  Signed, Fransico Him, MD 03/16/2013 9:15 AM

## 2013-03-16 NOTE — Patient Instructions (Addendum)
Your physician recommends that you continue on your current medications as directed. Please refer to the Current Medication list given to you today.  Please bring your machine to [facility] to get a download and have them fax over to our office.   Your physician has requested that you have an exercise tolerance test. For further information please visit [email or web address]. Please also follow instruction sheet, as given.   Your physician wants you to follow-up in: 6 Months with Dr Mallie Snooks will receive a reminder letter in the mail two months in advance. If you don't receive a letter, please call our office to schedule the follow-up appointment.

## 2013-03-23 ENCOUNTER — Ambulatory Visit (INDEPENDENT_AMBULATORY_CARE_PROVIDER_SITE_OTHER): Payer: 59 | Admitting: Gastroenterology

## 2013-03-23 ENCOUNTER — Encounter: Payer: Self-pay | Admitting: Gastroenterology

## 2013-03-23 ENCOUNTER — Other Ambulatory Visit (INDEPENDENT_AMBULATORY_CARE_PROVIDER_SITE_OTHER): Payer: 59

## 2013-03-23 VITALS — BP 118/80 | HR 80 | Ht 77.75 in | Wt 282.4 lb

## 2013-03-23 DIAGNOSIS — K760 Fatty (change of) liver, not elsewhere classified: Secondary | ICD-10-CM

## 2013-03-23 DIAGNOSIS — K7689 Other specified diseases of liver: Secondary | ICD-10-CM

## 2013-03-23 DIAGNOSIS — K219 Gastro-esophageal reflux disease without esophagitis: Secondary | ICD-10-CM

## 2013-03-23 LAB — HEPATIC FUNCTION PANEL
ALT: 90 U/L — AB (ref 0–53)
AST: 51 U/L — ABNORMAL HIGH (ref 0–37)
Albumin: 4.3 g/dL (ref 3.5–5.2)
Alkaline Phosphatase: 87 U/L (ref 39–117)
Bilirubin, Direct: 0.1 mg/dL (ref 0.0–0.3)
TOTAL PROTEIN: 7.4 g/dL (ref 6.0–8.3)
Total Bilirubin: 0.9 mg/dL (ref 0.3–1.2)

## 2013-03-23 LAB — VITAMIN B12: VITAMIN B 12: 583 pg/mL (ref 211–911)

## 2013-03-23 MED ORDER — METOCLOPRAMIDE HCL 10 MG PO TABS: 10.0000 mg | ORAL_TABLET | Freq: Every day | ORAL | Status: DC

## 2013-03-23 MED ORDER — RABEPRAZOLE SODIUM 20 MG PO TBEC: 20.0000 mg | DELAYED_RELEASE_TABLET | Freq: Two times a day (BID) | ORAL | Status: DC
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
## 2013-03-23 NOTE — Patient Instructions (Signed)
Please call back and make a follow up appointment with Dr. Hilarie Fredrickson in three months  We have sent the following medications to your pharmacy for you to pick up at your convenience: [facility] physician has requested that you go to the basement for the following lab work before leaving today: B 12  Liver profile

## 2013-03-24 ENCOUNTER — Telehealth: Payer: Self-pay | Admitting: Gastroenterology

## 2013-03-24 DIAGNOSIS — R945 Abnormal results of liver function studies: Principal | ICD-10-CM

## 2013-03-24 DIAGNOSIS — R7989 Other specified abnormal findings of blood chemistry: Secondary | ICD-10-CM

## 2013-03-27 NOTE — Telephone Encounter (Signed)
Notes Recorded by Sable Feil, MD on 03/24/2013 at 8:25 AM Appt i think has been made to see DR. Pyrtle....Marland KitchenMarland KitchenMay need liver biopsy....enzymes still elevate.Needs ceruloplasmin,ANA AMA PT done lmom for pt to call back.

## 2013-03-27 NOTE — Telephone Encounter (Signed)
Informed wife of Dr Buel Ream recommendations and the need for more labs. He will f/u with Dr Hilarie Fredrickson on 04/27/13.

## 2013-04-12 ENCOUNTER — Encounter (HOSPITAL_COMMUNITY): Payer: Self-pay | Admitting: Emergency Medicine

## 2013-04-12 ENCOUNTER — Emergency Department (HOSPITAL_COMMUNITY)
Admission: EM | Admit: 2013-04-12 | Discharge: 2013-04-12 | Disposition: A | Discharge status: Home / Self Care | Payer: Worker's Compensation | Attending: Emergency Medicine | Admitting: Emergency Medicine

## 2013-04-12 ENCOUNTER — Emergency Department (HOSPITAL_COMMUNITY): Payer: Worker's Compensation

## 2013-04-12 DIAGNOSIS — R05 Cough: Secondary | ICD-10-CM | POA: Insufficient documentation

## 2013-04-12 DIAGNOSIS — Z79899 Other long term (current) drug therapy: Secondary | ICD-10-CM | POA: Insufficient documentation

## 2013-04-12 DIAGNOSIS — Z8669 Personal history of other diseases of the nervous system and sense organs: Secondary | ICD-10-CM | POA: Insufficient documentation

## 2013-04-12 DIAGNOSIS — T59811A Toxic effect of smoke, accidental (unintentional), initial encounter: Secondary | ICD-10-CM

## 2013-04-12 DIAGNOSIS — Z860100 Personal history of colon polyps, unspecified: Secondary | ICD-10-CM | POA: Insufficient documentation

## 2013-04-12 DIAGNOSIS — R059 Cough, unspecified: Secondary | ICD-10-CM | POA: Insufficient documentation

## 2013-04-12 DIAGNOSIS — F411 Generalized anxiety disorder: Secondary | ICD-10-CM | POA: Insufficient documentation

## 2013-04-12 DIAGNOSIS — E7800 Pure hypercholesterolemia, unspecified: Secondary | ICD-10-CM | POA: Insufficient documentation

## 2013-04-12 DIAGNOSIS — K219 Gastro-esophageal reflux disease without esophagitis: Secondary | ICD-10-CM | POA: Insufficient documentation

## 2013-04-12 DIAGNOSIS — J705 Respiratory conditions due to smoke inhalation: Secondary | ICD-10-CM | POA: Insufficient documentation

## 2013-04-12 DIAGNOSIS — Z9889 Other specified postprocedural states: Secondary | ICD-10-CM | POA: Insufficient documentation

## 2013-04-12 LAB — CARBOXYHEMOGLOBIN
Carboxyhemoglobin: 1.3 % (ref 0.5–1.5)
Methemoglobin: 0.9 % (ref 0.0–1.5)
O2 SAT: 88 %
TOTAL HEMOGLOBIN: 13.1 g/dL — AB (ref 13.5–18.0)
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
## 2013-04-12 NOTE — ED Notes (Signed)
Pt being transported out of the department for testing 
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
## 2013-04-12 NOTE — ED Notes (Signed)
Pt undressed, in gown, on monitor, continuous pulse oximetry and blood pressure cuff; family at bedside 
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
## 2013-04-12 NOTE — ED Notes (Signed)
Carbon monoxide reading with EMS was 4%.

## 2013-04-13 NOTE — ED Provider Notes (Signed)
I saw and evaluated the patient, reviewed the resident's note and I agree with the findings and plan.  EKG Interpretation   None      53 year old with brief exposure and inhalation of unknown substance. Symptomatic, but objectively no evidence of significant hemodynamic instability or airway compromise. Suspect symptoms from airway irritation. No increased work of breathing. Lungs are clear. No worsening symptoms throughout ED stay. Return precautions discussed. Followup as needed otherwise.  Virgel Manifold, MD 04/13/13 1630

## 2013-04-21 ENCOUNTER — Encounter: Payer: 59 | Attending: Family Medicine | Admitting: Dietician

## 2013-04-21 ENCOUNTER — Encounter: Payer: Self-pay | Admitting: Dietician

## 2013-04-21 VITALS — Ht 77.0 in | Wt 283.3 lb

## 2013-04-21 DIAGNOSIS — K7689 Other specified diseases of liver: Secondary | ICD-10-CM

## 2013-04-21 DIAGNOSIS — E669 Obesity, unspecified: Secondary | ICD-10-CM | POA: Insufficient documentation

## 2013-04-21 DIAGNOSIS — Z713 Dietary counseling and surveillance: Secondary | ICD-10-CM | POA: Insufficient documentation

## 2013-04-21 NOTE — Progress Notes (Signed)
Medical Nutrition Therapy:  Appt start time: 0800 end time:  0900.  Assessment:  Primary concerns today: Overweight, fatty liver, dyslipidemia. Pt also has Hx of Barrett's esophagus, poor esophageal motility, hiatal hernia. GERD well controlled with current medication regimen.  Pt has recently made some positive efforts with wt loss, nearly 10 lbs lost in past 6-7 weeks.   Preferred Learning Style:   No preference indicated   Learning Readiness:   Contemplating  MEDICATIONS:  See list.   DIETARY INTAKE: Usual eating pattern includes 3 meals and 1 snacks per day. Everyday foods include granola bars.  Avoided foods include none noted.    24-hr recall:  B ( AM): usually two granola bars at about 110 kcal each plus a diet sundrop. Used to be a poptart plus diet sundrop. Snk ( AM): very rare  L ( PM): often late. Usually a salad with chix. Actively avoiding french fries, most potatoes. Other options include sushi, chik-fil-a (fried), steak burrito with chips and salsa. Water or half and half sweet/unsweet tea or diet soda.  Snk ( PM): very rare D ( PM): most often grilled chix with side salad or veg plus sweet potato with butter. Has recently been home cooked meals, but with both kids playing baseball/softball, he expects to eat out more often. He does most cooking, and enjoys cooking salmon, chix, extra lean hamburger meat to make taco salad. Used to eat out more often, but worried this will pick up.  Snk ( PM): apple with a bit of salt.  Beverages: diet sodas, water, half and half tea. Rare EtOH.   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 Goal is to begin doing more walking with wife as weather gets warmer. Has enjoyed hunting and fishing, outdoor work, Teacher, adult education care, gardening. When he was in his 48s, he used to do martial arts and lift weights.   Progress Towards Goal(s):  In progress.   Nutritional Diagnosis:  [place]-3.3  Overweight/obesity As related to low physical activity, hx of high kcal choices.  As evidenced by BMI>30, pt diet and activity recall.    Intervention:  Nutrition counseling provided on diet for weight loss, fatty liver. Particular emphasis placed on increased consumption of nonstarchy vegetable, lower carbohydrate (especially sugar) and fat intake, while maintaining high protein intake. Fast and easy options for picking up carry out for lunch and/or dinner were discussed, as well as frozen pre-prepared meal options low in fat and total kcal as options for getting home late after his children's sports activities. Additionally, RD provided a list of independently tested and approved protein supplements to consider for a snack option or meal replacement. Increasing protein content and reducing carb content of breakfast was stressed as first alteration to current diet regimen. Additionally, RD recommended a few forms of exercise requiring minimal time and equipment investment, including walking around the baseball field while watching his children play.   Teaching Method Utilized:  Visual Auditory  Handouts given during visit include:  Best Pro, Fat, and CHO rich foods  Home Workout  Approved Supplements  Barriers to learning/adherence to lifestyle change: time, hectic lifestyle  Demonstrated degree of understanding via:  Teach Back   Monitoring/Evaluation:  Dietary intake, exercise, portion control, and body weight in 2 month(s).

## 2013-04-25 ENCOUNTER — Other Ambulatory Visit (INDEPENDENT_AMBULATORY_CARE_PROVIDER_SITE_OTHER): Payer: 59

## 2013-04-25 DIAGNOSIS — R7989 Other specified abnormal findings of blood chemistry: Secondary | ICD-10-CM

## 2013-04-25 DIAGNOSIS — R945 Abnormal results of liver function studies: Principal | ICD-10-CM

## 2013-04-25 LAB — PROTIME-INR
INR: 1.2 ratio — ABNORMAL HIGH (ref 0.8–1.0)
Prothrombin Time: 12.3 s (ref 10.2–12.4)

## 2013-04-26 ENCOUNTER — Encounter: Payer: Self-pay | Admitting: Internal Medicine

## 2013-04-26 ENCOUNTER — Ambulatory Visit (INDEPENDENT_AMBULATORY_CARE_PROVIDER_SITE_OTHER): Payer: 59 | Admitting: Physician Assistant

## 2013-04-26 DIAGNOSIS — R0789 Other chest pain: Secondary | ICD-10-CM

## 2013-04-26 LAB — ANA: Anti Nuclear Antibody(ANA): NEGATIVE

## 2013-04-26 NOTE — Progress Notes (Signed)
Exercise Treadmill Test  Pre-Exercise Testing Evaluation Rhythm: normal sinus  Rate: 71 bpm     Test  Exercise Tolerance Test Ordering MD: Fransico Him, MD  Interpreting MD: Richardson Dopp, PA-C  Unique Test No: 1  Treadmill:  1  Indication for ETT: chest pain - rule out ischemia  Contraindication to ETT: No   Stress Modality: exercise - treadmill  Cardiac Imaging Performed: non   Protocol: standard Bruce - maximal  Max BP:  169/64  Max MPHR (bpm):  170 85% MPR (bpm):  145  MPHR obtained (bpm):  164 % MPHR obtained:  96  Reached 85% MPHR (min:sec):  7:32 Total Exercise Time (min-sec):  10:00  Workload in METS:  11.7 Borg Scale: 16  Reason ETT Terminated:  desired heart rate attained    ST Segment Analysis At Rest: normal ST segments - no evidence of significant ST depression With Exercise: non-specific ST changes  Other Information Arrhythmia:  No Angina during ETT:  present (1) Quality of ETT:  diagnostic  ETT Interpretation:  normal - no evidence of ischemia by ST analysis  Comments: Excellent exercise capacity. Patient did note occasional, brief, sharp chest pains. Normal BP response to exercise. There was insignificant up-sloping ST depression at peak exercise.  No significant changes to suggest ischemia.    Recommendations: F/u with Dr. Fransico Him as planned.  Signed, Richardson Dopp, PA-C   04/26/2013 9:45 AM

## 2013-04-27 ENCOUNTER — Ambulatory Visit (INDEPENDENT_AMBULATORY_CARE_PROVIDER_SITE_OTHER): Payer: 59 | Admitting: Internal Medicine

## 2013-04-27 ENCOUNTER — Encounter: Payer: Self-pay | Admitting: Cardiology

## 2013-04-27 ENCOUNTER — Encounter: Payer: Self-pay | Admitting: Internal Medicine

## 2013-04-27 VITALS — BP 124/72 | HR 82 | Ht 77.0 in | Wt 280.0 lb

## 2013-04-27 DIAGNOSIS — K824 Cholesterolosis of gallbladder: Secondary | ICD-10-CM

## 2013-04-27 DIAGNOSIS — K7689 Other specified diseases of liver: Secondary | ICD-10-CM

## 2013-04-27 DIAGNOSIS — K219 Gastro-esophageal reflux disease without esophagitis: Secondary | ICD-10-CM

## 2013-04-27 DIAGNOSIS — K760 Fatty (change of) liver, not elsewhere classified: Secondary | ICD-10-CM

## 2013-04-27 LAB — MITOCHONDRIAL ANTIBODIES: Mitochondrial M2 Ab, IgG: 0.29 (ref <0.91)

## 2013-04-27 MED ORDER — VITAMIN E 180 MG (400 UNIT) PO CAPS: 400.0000 [IU] | ORAL_CAPSULE | Freq: Every day | ORAL | Status: DC

## 2013-04-27 NOTE — Progress Notes (Signed)
Subjective:    Patient ID: Timothy Dixon, male    DOB: [birth year], 53 y.o.   MRN: [id number]  HPI Timothy Dixon is a 53 year old male with a past medical history of GERD, hiatal hernia, elevated liver enzymes felt secondary to nonalcoholic fatty liver disease, hyperlipidemia he was previously managed by Dr. Sharlett Iles who is transferred to me and is seen for the first time today. He is here alone today. He reports he is feeling well. He has been working on weight reduction, and has been able to lose approximately 10-12 pounds. He has done this by dietary modification. He hopes as the weather warms to be able to be more active outside. He denies history of jaundice, itching, ascites or lower extremity edema. No GI bleeding. No gingival bleeding. No nausea or vomiting. Bowel habits are regular without blood or melena. Reflux disease has been well controlled recently on AcipHex 20 mg twice daily and metoclopramide 10 mg at bedtime. He says metoclopramide was added to help with nocturnal regurgitation and he has found it to be quite helpful.  No family history of liver disease. No significant alcohol use  He has had previous EGD and colonoscopy with Dr. Sharlett Iles   Review of Systems As per history of present illness, otherwise negative  Current Medications, Allergies, Past Medical History, Past Surgical History, Family History and Social History were reviewed in [facility] record.     Objective:   Physical Exam BP 124/72  Pulse 82  Ht 6' 5"  (1.956 m)  Wt 280 lb (127.007 kg)  BMI 33.20 kg/m2 Constitutional: Well-developed and well-nourished. No distress. HEENT: Normocephalic and atraumatic. Oropharynx is clear and moist. No oropharyngeal exudate. Conjunctivae are normal.  No scleral icterus. Neck: Neck supple. Trachea midline. Cardiovascular: Normal rate, regular rhythm and intact distal pulses. No M/R/G Pulmonary/chest: Effort normal and breath sounds normal.  No wheezing, rales or rhonchi. Abdominal: Soft, nontender, nondistended. Bowel sounds active throughout. There are no masses palpable. No hepatosplenomegaly. Extremities: no clubbing, cyanosis, or edema Lymphadenopathy: No cervical adenopathy noted. Neurological: Alert and oriented to person place and time. Skin: Skin is warm and dry. No rashes noted. Psychiatric: Normal mood and affect. Behavior is normal.  CBC    Component Value Date/Time   WBC 5.5 06/25/2011 1005   RBC 5.31 06/25/2011 1005   HGB 14.3 06/25/2011 1005   HCT 42.4 06/25/2011 1005   PLT 161.0 06/25/2011 1005   MCV 79.9 06/25/2011 1005   MCHC 33.7 06/25/2011 1005   RDW 13.9 06/25/2011 1005   LYMPHSABS 2.0 06/25/2011 1005   MONOABS 0.7 06/25/2011 1005   EOSABS 0.1 06/25/2011 1005   BASOSABS 0.0 06/25/2011 1005    CMP     Component Value Date/Time   NA 140 06/25/2011 1005   K 4.3 06/25/2011 1005   CL 107 06/25/2011 1005   CO2 27 06/25/2011 1005   GLUCOSE 93 06/25/2011 1005   BUN 16 06/25/2011 1005   CREATININE 1.0 06/25/2011 1005   CALCIUM 9.6 06/25/2011 1005   PROT 7.4 03/23/2013 1016   ALBUMIN 4.3 03/23/2013 1016   AST 51* 03/23/2013 1016   ALT 90* 03/23/2013 1016   ALKPHOS 87 03/23/2013 1016   BILITOT 0.9 03/23/2013 1016   GFRNONAA >60 03/22/2007 2309   GFRAA  Value: >60        The eGFR has been calculated using the MDRD equation. This calculation has not been validated in all clinical 03/22/2007 2309    INR 1.2  ANA  neg Ferritin previously normal Ceruloplasmin neg TSH normal  [place] - 2010 fatty liver and gallbladder polyp, reviewed report    Assessment & Plan:  53 year old male with a past medical history of GERD, hiatal hernia, elevated liver enzymes felt secondary to nonalcoholic fatty liver disease, hyperlipidemia he was previously managed by Dr. Sharlett Iles who is transferred to me and is seen for the first time today.  1.  elevated liver enzymes/fatty liver -- nonalcoholic fatty liver disease is felt to be the cause of his liver enzyme elevation.  ALT is approximately 2 times the upper limit of normal. Other labs for genetic causes of liver inflammation have been negative. I recommend repeating the liver and gallbladder ultrasound at this time given his last imaging was 5 years ago. I also recommended vitamin D 800 international units daily. Stressed importance of low fat diet, daily exercise and attempt at weight reduction. He has already lost 10 pounds which I congratulated him on. I encouraged him to strive for continued weight reduction with a goal of a 10% weight loss. I like to see him back in 3 months with repeat liver enzymes on that day. I will check an IgG on that day and also celiac panel.  2.  Gallbladder polyp -- small, but repeat ultrasound to assess stability. If increasing in size may need cholecystectomy   3.  GERD -- well controlled currently with AcipHex 20 mg twice daily. He will continue with this dose. Metoclopramide 10 mg at bedtime has helped him dramatically. We discussed the possible neurologic side effects of this medication when used long-term, specifically tardive dyskinesia and how this can be a reversible. He is low risk for this given his low overall dose,  and for now he is okay continue with this medication given that it has helped significantly. I recommended that we continue lowest possible dose and try drug holidays in the future.    4.  CRC screening -- plan repeat colonoscopy May 2018, last colonoscopy with less than good prep. Dr. Sharlett Iles recommended five-year repeat    Return in 3 months

## 2013-04-27 NOTE — Patient Instructions (Addendum)
Your physician has requested that you go to the basement in June at your convenience for labs.  We have sent the following medications to your pharmacy for you to pick up at your convenience: Vitamin E 800 iu daily. This can be purchased over the counter.  Continue taking  Aciphex and Reglan. Work on Diet and weight reduction  Follow up with Dr. Hilarie Fredrickson in 3 months. After you have labs done.    You have been scheduled for an abdominal ultrasound with liver and gallbladder at [facility] (1st floor of hospital) on 05/03/2013 at 8:30. Please arrive 15 minutes prior to your appointment for registration. Make certain not to have anything to eat or drink 6 hours prior to your appointment. Should you need to reschedule your appointment, please contact radiology at [phone]. This test typically takes about 30 minutes to perform.                                                We are excited to introduce MyChart, a new best-in-class service that provides you online access to important information in your electronic medical record. We want to make it easier for you to view your health information - all in one secure location - when and where you need it. We expect MyChart will enhance the quality of care and service we provide.  When you register for MyChart, you can:    View your test results.    Request appointments and receive appointment reminders via email.    Request medication renewals.    View your medical history, allergies, medications and immunizations.    Communicate with your physician's office through a password-protected site.    Conveniently print information such as your medication lists.  To find out if MyChart is right for you, please talk to a member of our clinical staff today. We will gladly answer your questions about this free health and wellness tool.  If you are age 51 or older and want a member of your family to have access to your record, you must  provide written consent by completing a proxy form available at our office. Please speak to our clinical staff about guidelines regarding accounts for patients younger than age 51.  As you activate your MyChart account and need any technical assistance, please call the MyChart technical support line at [phone]) or email your question to [email or web address]. If you email your question(s), please include your name, a return phone number and the best time to reach you.  If you have non-urgent health-related questions, you can send a message to our office through [place] [facility] [email or web address]. If you have a medical emergency, call 911.  Thank you for using MyChart as your new health and wellness resource!   MyChart licensed from [employer],  1999-2010. Patents Pending.

## 2013-04-28 ENCOUNTER — Encounter: Payer: Self-pay | Admitting: General Surgery

## 2013-04-28 LAB — CERULOPLASMIN: Ceruloplasmin: 31 mg/dL (ref 18–36)

## 2013-05-03 ENCOUNTER — Ambulatory Visit (HOSPITAL_COMMUNITY)
Admission: RE | Admit: 2013-05-03 | Discharge: 2013-05-03 | Disposition: A | Discharge status: Home / Self Care | Payer: 59 | Source: Ambulatory Visit | Attending: Internal Medicine | Admitting: Internal Medicine

## 2013-05-03 ENCOUNTER — Encounter: Payer: Self-pay | Admitting: Internal Medicine

## 2013-05-03 DIAGNOSIS — K824 Cholesterolosis of gallbladder: Secondary | ICD-10-CM | POA: Insufficient documentation

## 2013-05-03 DIAGNOSIS — K7689 Other specified diseases of liver: Secondary | ICD-10-CM | POA: Insufficient documentation

## 2013-05-03 DIAGNOSIS — K760 Fatty (change of) liver, not elsewhere classified: Secondary | ICD-10-CM

## 2013-05-03 DIAGNOSIS — K219 Gastro-esophageal reflux disease without esophagitis: Secondary | ICD-10-CM

## 2013-05-16 ENCOUNTER — Encounter: Payer: Self-pay | Admitting: Internal Medicine

## 2013-06-15 ENCOUNTER — Telehealth: Payer: Self-pay | Admitting: Internal Medicine

## 2013-06-15 MED ORDER — METOCLOPRAMIDE HCL 10 MG PO TABS: 10.0000 mg | ORAL_TABLET | Freq: Every day | ORAL | Status: DC

## 2013-06-15 MED ORDER — RABEPRAZOLE SODIUM 20 MG PO TBEC: 20.0000 mg | DELAYED_RELEASE_TABLET | Freq: Two times a day (BID) | ORAL | Status: DC

## 2013-06-15 NOTE — Telephone Encounter (Signed)
Sent in [place] and Reglan to pts pharmacy.

## 2013-06-26 ENCOUNTER — Ambulatory Visit: Payer: Self-pay | Admitting: Internal Medicine

## 2013-06-29 ENCOUNTER — Encounter: Payer: 59 | Attending: Family Medicine | Admitting: Dietician

## 2013-06-29 VITALS — Ht 77.0 in | Wt 278.2 lb

## 2013-06-29 DIAGNOSIS — Z713 Dietary counseling and surveillance: Secondary | ICD-10-CM | POA: Insufficient documentation

## 2013-06-29 DIAGNOSIS — E669 Obesity, unspecified: Secondary | ICD-10-CM

## 2013-06-29 NOTE — Progress Notes (Signed)
Medical Nutrition Therapy:  Appt start time: 0500 end time:  0515.  Assessment:    Mr. Droke returns today having lost 5 pounds since his last visit. He is excited to report that his liver levels have returned to normal. He has been trying to eat healthier foods and cut back on portion sizes. Having protein powder occasionally as a meal replacement. Eating more salads and grilled fish and chicken rather than fried foods. Has not been able to implement an exercise routine. Hopes to start exercise regimen soon, as kids are out of school and finishing sports for the season.   Preferred Learning Style:   No preference indicated   Learning Readiness:   Ready  MEDICATIONS:  See list.   DIETARY INTAKE: Usual eating pattern includes 3 meals and 1 snacks per day. Everyday foods include granola bars.  Avoided foods include none noted.    24-hr recall:  B ( AM): usually two granola bars at about 110 kcal each plus a diet sundrop. Used to be a poptart plus diet sundrop. Snk ( AM): very rare  L ( PM): often late. Usually a salad with chix. Actively avoiding french fries, most potatoes. Other options include sushi, chik-fil-a (fried), steak burrito with chips and salsa. Water or half and half sweet/unsweet tea or diet soda.  Snk ( PM): very rare D ( PM): most often grilled chix with side salad or veg plus sweet potato with butter. Has recently been home cooked meals, but with both kids playing baseball/softball, he expects to eat out more often. He does most cooking, and enjoys cooking salmon, chix, extra lean hamburger meat to make taco salad. Used to eat out more often, but worried this will pick up.  Snk ( PM): apple with a bit of salt.  Beverages: diet sodas, water, half and half tea. Rare EtOH.   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 Goal is to begin doing more walking with wife as weather gets warmer. Has enjoyed hunting and  fishing, outdoor work, Teacher, adult education care, gardening. When he was in his 56s, he used to do martial arts and lift weights.   Progress Towards Goal(s):  In progress.   Nutritional Diagnosis:  [place]-3.3 Overweight/obesity As related to low physical activity, hx of high kcal choices.  As evidenced by BMI>30, pt diet and activity recall.    Intervention:  Encouraged patient to continue positive lifestyle changes. Discussed importance of implementing an exercise regimen.  Teaching Method Utilized:  Visual Auditory   Barriers to learning/adherence to lifestyle change: time, hectic lifestyle  Demonstrated degree of understanding via:  Teach Back   Monitoring/Evaluation:  Dietary intake, exercise, portion control, and body weight prn.

## 2013-07-12 ENCOUNTER — Ambulatory Visit: Payer: Self-pay | Admitting: Internal Medicine

## 2013-07-27 ENCOUNTER — Encounter: Payer: Self-pay | Admitting: Internal Medicine

## 2013-07-31 ENCOUNTER — Encounter: Payer: Self-pay | Admitting: Internal Medicine

## 2013-07-31 ENCOUNTER — Ambulatory Visit (INDEPENDENT_AMBULATORY_CARE_PROVIDER_SITE_OTHER): Payer: 59 | Admitting: Internal Medicine

## 2013-07-31 VITALS — BP 120/86 | HR 84 | Ht 77.75 in | Wt 277.2 lb

## 2013-07-31 DIAGNOSIS — K760 Fatty (change of) liver, not elsewhere classified: Secondary | ICD-10-CM

## 2013-07-31 DIAGNOSIS — K7689 Other specified diseases of liver: Secondary | ICD-10-CM

## 2013-07-31 DIAGNOSIS — K219 Gastro-esophageal reflux disease without esophagitis: Secondary | ICD-10-CM

## 2013-07-31 DIAGNOSIS — K824 Cholesterolosis of gallbladder: Secondary | ICD-10-CM

## 2013-07-31 MED ORDER — METOCLOPRAMIDE HCL 10 MG PO TABS: 10.0000 mg | ORAL_TABLET | Freq: Every day | ORAL | Status: DC

## 2013-07-31 NOTE — Patient Instructions (Signed)
We have sent the following medications to your pharmacy for you to pick up at your convenience: reglan  Your physician has requested that you go to the basement for the following lab work before leaving today: labs that were ordered in March have drawn  Target weight 250 lb.  Follow up in office in 6 months   Fat and Cholesterol Control Diet Fat and cholesterol levels in your blood and organs are influenced by your diet. High levels of fat and cholesterol may lead to diseases of the heart, small and large blood vessels, gallbladder, liver, and pancreas. CONTROLLING FAT AND CHOLESTEROL WITH DIET Although exercise and lifestyle factors are important, your diet is key. That is because certain foods are known to raise cholesterol and others to lower it. The goal is to balance foods for their effect on cholesterol and more importantly, to replace saturated and trans fat with other types of fat, such as monounsaturated fat, polyunsaturated fat, and omega-3 fatty acids. On average, a person should consume no more than 15 to 17 g of saturated fat daily. Saturated and trans fats are considered "bad" fats, and they will raise LDL cholesterol. Saturated fats are primarily found in animal products such as meats, butter, and cream. However, that does not mean you need to give up all your favorite foods. Today, there are good tasting, low-fat, low-cholesterol substitutes for most of the things you like to eat. Choose low-fat or nonfat alternatives. Choose round or loin cuts of red meat. These types of cuts are lowest in fat and cholesterol. Chicken (without the skin), fish, veal, and ground [place] breast are great choices. Eliminate fatty meats, such as hot dogs and salami. Even shellfish have little or no saturated fat. Have a 3 oz (85 g) portion when you eat lean meat, poultry, or fish. Trans fats are also called "partially hydrogenated oils." They are oils that have been scientifically manipulated so that they are  solid at room temperature resulting in a longer shelf life and improved taste and texture of foods in which they are added. Trans fats are found in stick margarine, some tub margarines, cookies, crackers, and baked goods.  When baking and cooking, oils are a great substitute for butter. The monounsaturated oils are especially beneficial since it is believed they lower LDL and raise HDL. The oils you should avoid entirely are saturated tropical oils, such as coconut and palm.  Remember to eat a lot from food groups that are naturally free of saturated and trans fat, including fish, fruit, vegetables, beans, grains (barley, rice, couscous, bulgur wheat), and pasta (without cream sauces).  IDENTIFYING FOODS THAT LOWER FAT AND CHOLESTEROL  Soluble fiber may lower your cholesterol. This type of fiber is found in fruits such as apples, vegetables such as broccoli, potatoes, and carrots, legumes such as beans, peas, and lentils, and grains such as barley. Foods fortified with plant sterols (phytosterol) may also lower cholesterol. You should eat at least 2 g per day of these foods for a cholesterol lowering effect.  Read package labels to identify low-saturated fats, trans fat free, and low-fat foods at the supermarket. Select cheeses that have only 2 to 3 g saturated fat per ounce. Use a heart-healthy tub margarine that is free of trans fats or partially hydrogenated oil. When buying baked goods (cookies, crackers), avoid partially hydrogenated oils. Breads and muffins should be made from whole grains (whole-wheat or whole oat flour, instead of "flour" or "enriched flour"). Buy non-creamy canned soups with reduced  salt and no added fats.  FOOD PREPARATION TECHNIQUES  Never deep-fry. If you must fry, either stir-fry, which uses very little fat, or use non-stick cooking sprays. When possible, broil, bake, or roast meats, and steam vegetables. Instead of putting butter or margarine on vegetables, use lemon and herbs,  applesauce, and cinnamon (for squash and sweet potatoes). Use nonfat yogurt, salsa, and low-fat dressings for salads.  LOW-SATURATED FAT / LOW-FAT FOOD SUBSTITUTES Meats / Saturated Fat (g)  Avoid: Steak, marbled (3 oz/85 g) / 11 g  Choose: Steak, lean (3 oz/85 g) / 4 g  Avoid: Hamburger (3 oz/85 g) / 7 g  Choose: Hamburger, lean (3 oz/85 g) / 5 g  Avoid: Ham (3 oz/85 g) / 6 g  Choose: Ham, lean cut (3 oz/85 g) / 2.4 g  Avoid: Chicken, with skin, dark meat (3 oz/85 g) / 4 g  Choose: Chicken, skin removed, dark meat (3 oz/85 g) / 2 g  Avoid: Chicken, with skin, light meat (3 oz/85 g) / 2.5 g  Choose: Chicken, skin removed, light meat (3 oz/85 g) / 1 g Dairy / Saturated Fat (g)  Avoid: Whole milk (1 cup) / 5 g  Choose: Low-fat milk, 2% (1 cup) / 3 g  Choose: Low-fat milk, 1% (1 cup) / 1.5 g  Choose: Skim milk (1 cup) / 0.3 g  Avoid: Hard cheese (1 oz/28 g) / 6 g  Choose: Skim milk cheese (1 oz/28 g) / 2 to 3 g  Avoid: Cottage cheese, 4% fat (1 cup) / 6.5 g  Choose: Low-fat cottage cheese, 1% fat (1 cup) / 1.5 g  Avoid: Ice cream (1 cup) / 9 g  Choose: Sherbet (1 cup) / 2.5 g  Choose: Nonfat frozen yogurt (1 cup) / 0.3 g  Choose: Frozen fruit bar / trace  Avoid: Whipped cream (1 tbs) / 3.5 g  Choose: Nondairy whipped topping (1 tbs) / 1 g Condiments / Saturated Fat (g)  Avoid: Mayonnaise (1 tbs) / 2 g  Choose: Low-fat mayonnaise (1 tbs) / 1 g  Avoid: Butter (1 tbs) / 7 g  Choose: Extra light margarine (1 tbs) / 1 g  Avoid: Coconut oil (1 tbs) / 11.8 g  Choose: Olive oil (1 tbs) / 1.8 g  Choose: Corn oil (1 tbs) / 1.7 g  Choose: Safflower oil (1 tbs) / 1.2 g  Choose: Sunflower oil (1 tbs) / 1.4 g  Choose: Soybean oil (1 tbs) / 2.4 g  Choose: Canola oil (1 tbs) / 1 g Document Released: 02/02/2005 Document Revised: 05/30/2012 Document Reviewed: 07/24/2010 ExitCare Patient Information 2014 [place], [place].

## 2013-07-31 NOTE — Progress Notes (Signed)
Subjective:    Patient ID: Timothy Dixon, male    DOB: [birth year], 53 y.o.   MRN: [id number]  HPI Timothy Dixon is a 53 year old male with a past medical history of GERD, hiatal hernia, elevated liver enzymes felt secondary to non-on-call if any liver disease, hyperlipidemia who is seen for followup. He was last seen on 04/27/2013. At that appointment we discussed the importance of a low-fat diet with daily exercise and attempt to lose weight. He was also started on vitamin E and oral B12. Ultrasound of the abdomen was set up to again evaluate the liver and also a previously seen gallbladder polyp.  Today he reports he is feeling well. No new complaints. He has tried it a better, more healthy diet. He is focusing on meats such as fish and also green leafy vegetables. He has lost approximately 3 pounds. 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 He denies abdominal pain. Heartburn has been well controlled on AcipHex 20 mg twice daily. He does take metoclopramide 10 mg at bedtime which has helped him with swallowing and nocturnal regurgitation. No new dysphagia or odynophagia. Bowel movements are regular without blood or melena. No lower extremity edema.   Review of Systems As per history of present illness, otherwise negative  Current Medications, Allergies, Past Medical History, Past Surgical History, Family History and Social History were reviewed in [facility] record.     Objective:   Physical Exam BP 120/86  Pulse 84  Ht 6' 5.75" (1.975 m)  Wt 277 lb 4 oz (125.76 kg)  BMI 32.24 kg/m2 Constitutional: Well-developed and well-nourished. No distress. HEENT: Normocephalic and atraumatic. Conjunctivae are normal.  No scleral icterus. Neck: Neck supple. Trachea midline. Cardiovascular: Normal rate, regular rhythm and intact distal pulses. No M/R/G Pulmonary/chest: Effort normal and breath sounds  normal. No wheezing, rales or rhonchi. Abdominal: Soft, nontender, nondistended. Bowel sounds active throughout. There are no masses palpable. No hepatosplenomegaly. Extremities: no clubbing, cyanosis, or edema Neurological: Alert and oriented to person place and time. Psychiatric: Normal mood and affect. Behavior is normal.  CMP     Component Value Date/Time   NA 140 06/25/2011 1005   K 4.3 06/25/2011 1005   CL 107 06/25/2011 1005   CO2 27 06/25/2011 1005   GLUCOSE 93 06/25/2011 1005   BUN 16 06/25/2011 1005   CREATININE 1.0 06/25/2011 1005   CALCIUM 9.6 06/25/2011 1005   PROT 7.4 03/23/2013 1016   ALBUMIN 4.3 03/23/2013 1016   AST 51* 03/23/2013 1016   ALT 90* 03/23/2013 1016   ALKPHOS 87 03/23/2013 1016   BILITOT 0.9 03/23/2013 1016   GFRNONAA >60 03/22/2007 2309   GFRAA  Value: >60        The eGFR has been calculated using the MDRD equation. This calculation has not been validated in all clinical 03/22/2007 2309   INR 1.2 ANA negative, ferritin previously normal, ceruloplasmin normal, TSH normal  ULTRASOUND ABDOMEN COMPLETE -- 2015   COMPARISON:  01/30/2009   FINDINGS: Gallbladder:   There is a echogenic structure along the gallbladder wall that measures 0.7 cm. This has minimally changed since 2010. Findings are most compatible with a gallbladder polyp. No evidence for gallstones or wall thickening. The patient does not have a sonographic Murphy's sign.   Common bile duct:   Diameter: 0.3 cm   Liver:   The liver parenchyma is diffusely heterogeneous and echogenic. There is  a 2.0 x 1.9 x 1.2 cm hypoechoic area in the inferior right hepatic lobe and adjacent to the gallbladder. Margins of this area are irregular but there does not appear to be mass effect. There was a similar hypoechoic area near the gallbladder on the previous examination. Main portal vein is patent.   IVC:   No abnormality visualized.   Pancreas:   Not well visualized.   Spleen:   Spleen measures 10.8 cm in  length with a normal morphology.   Right Kidney:   Length: 11.9 cm. Echogenicity within normal limits. No mass or hydronephrosis visualized.   Left Kidney:   Length: 13.3 cm. Echogenicity within normal limits. No mass or hydronephrosis visualized.   Abdominal aorta:   No aneurysm visualized.   Other findings:   None.   IMPRESSION: The liver is diffusely echogenic and heterogeneous. Findings are consistent with hepatic steatosis. There is an ill-defined hypoechoic area in the right hepatic lobe and adjacent to the gallbladder. Based on the location and appearance of the liver, the hypoechoic area is most compatible with focal fat sparing. In addition, this was present on the previous examination although it is more conspicuous on this examination.   Gallbladder polyp that has minimally changed since 2010.        Assessment & Plan:  53 year old male with a past medical history of GERD, hiatal hernia, elevated liver enzymes felt secondary to non-on-call if any liver disease, hyperlipidemia who is seen for followup.   1.  NAFLD -- after evaluation and repeat ultrasound steatohepatitis is felt to explain his elevated AST and ALT. For completeness I have ordered IgG and celiac panel today. Will repeat hepatic function panel and INR. He will continue oral vitamin E 800 international units daily. Also encouraged him to continue diet and exercise. Goal weight reduction of 10% which would put him at 250 pounds. He feels like this is a good goal and would be a great weight for him. He will continue to work with primary care to control hypertension, hyperlipidemia and mild elevation in fasting glucose nonmucoid here for diabetes  2.  Gallbladder polyp -- small and stable over a five-year period. No indication for cholecystectomy based on stability  3.  GERD -- well controlled. He will continue current dose of AcipHex. He also has benefited from at bedtime metoclopramide. We had previously  discussed side effects of this medicine, which he understands. We will continue this dose for now.  4.  CRC screening -- repeat colonoscopy due May 2018  Return in 6 months, sooner if necessary

## 2013-09-11 ENCOUNTER — Encounter: Payer: Self-pay | Admitting: Internal Medicine

## 2013-09-11 NOTE — Progress Notes (Signed)
Patient's Rabeprazole sodium has been approved by [employer]. PA # is [id number].

## 2013-09-15 ENCOUNTER — Telehealth: Payer: Self-pay | Admitting: *Deleted

## 2013-09-15 NOTE — Telephone Encounter (Signed)
I have spoken to patient to advise that per his pharmacy, Aciphex script went through (after prior authorization through [facility]) for a $40.00 copay per month. I have advised that he should go online to get the Aciphex Copay assistance card to get an extra $20.00 off of his copay every month. He verbalizes understanding.   (prior auth notes for future: hx barretts esophagus; pt has tried and failed Nexium, Prilosec, Prevacid in the past)

## 2013-10-02 ENCOUNTER — Telehealth: Payer: Self-pay | Admitting: General Surgery

## 2013-10-02 NOTE — Telephone Encounter (Signed)
Betsy called and made aware pt needed to have OV before he would be able to get a new machine. We had a cancellation this Thursday and I was able to set him up for this Thursday at 10:45. LVM on pts machine asking him to call us back to let us know if the appt did or did not work. I Did make him aware that we probably wouldn't have another availability until October if he could not make this appt.   To Dr Radford Pax to make aware.

## 2013-10-02 NOTE — Telephone Encounter (Signed)
Message copied by Lily Kocher on Mon Oct 02, 2013 11:48 AM ------      Message from: Gilda Crease      Created: Mon Oct 02, 2013 11:14 AM      Regarding: office visit       Patient came into retail store and his unit is broken.  We are sending it off for repairs and Jonni Sanger gave him a Materials engineer.  In the meantime, patient will need an office visit to discuss his usage and benefit of therapy for him to qualify for a replacement.            Thanks for your help!      Betsy ------

## 2013-10-04 ENCOUNTER — Encounter: Payer: Self-pay | Admitting: Cardiology

## 2013-10-05 ENCOUNTER — Other Ambulatory Visit: Payer: Self-pay | Admitting: General Surgery

## 2013-10-05 ENCOUNTER — Encounter: Payer: Self-pay | Admitting: Cardiology

## 2013-10-05 ENCOUNTER — Ambulatory Visit (INDEPENDENT_AMBULATORY_CARE_PROVIDER_SITE_OTHER): Payer: 59 | Admitting: Cardiology

## 2013-10-05 VITALS — BP 118/78 | HR 76 | Ht 77.0 in | Wt 278.0 lb

## 2013-10-05 DIAGNOSIS — R0789 Other chest pain: Secondary | ICD-10-CM

## 2013-10-05 DIAGNOSIS — G4733 Obstructive sleep apnea (adult) (pediatric): Secondary | ICD-10-CM

## 2013-10-05 DIAGNOSIS — E669 Obesity, unspecified: Secondary | ICD-10-CM

## 2013-10-05 NOTE — Patient Instructions (Signed)
Your physician recommends that you continue on your current medications as directed. Please refer to the Current Medication list given to you today.  [facility] was sent the order for your new CPAP machine and Supplies if needed.   Your physician wants you to follow-up in: 6 months with Dr Mallie Snooks will receive a reminder letter in the mail two months in advance. If you don't receive a letter, please call our office to schedule the follow-up appointment.

## 2013-10-05 NOTE — Progress Notes (Addendum)
[address] [place], [place]  [address] Phone: [phone] Fax:  [phone]  Date:  10/05/2013   ID:  Timothy Dixon, Timothy Dixon 09-23-1960, MRN [id number]  PCP:  London Pepper, MD  Cardiologist:  Fransico Him, MD     History of Present Illness: Timothy Dixon is a 53 y.o. male with a history of OSA and obesity who presents today for evaluation of OSA. He is doing well with his CPAP therapy. He tolerates the device without any problems but currently is using a loaner because his broke and he presents today for sleep followup so that he can get a new CPAP device. He tolerates the nasal pillow mask.  He feels rested in the am and has no daytime sleepiness. When I saw him last he was concerned that he occasionally had some sharp pains in his chest that were similar to what he had a few years ago. Recent ETT was normal.    Wt Readings from Last 3 Encounters:  07/31/13 277 lb 4 oz (125.76 kg)  06/29/13 278 lb 3.2 oz (126.191 kg)  04/27/13 280 lb (127.007 kg)     Past Medical History  Diagnosis Date  . Hypercholesterolemia   . Sleep apnea   . Esophageal reflux   . Hiatal hernia   . Barrett esophagus   . Fatty liver   . Gastric polyp   . OSA (obstructive sleep apnea)   . GERD (gastroesophageal reflux disease)   . Anxiety     Current Outpatient Prescriptions  Medication Sig Dispense Refill  . atorvastatin (LIPITOR) 20 MG tablet Take 20 mg by mouth daily at 6 PM.       . busPIRone (BUSPAR) 10 MG tablet Take 10 mg by mouth 2 (two) times daily.       . metoCLOPramide (REGLAN) 10 MG tablet Take 1 tablet (10 mg total) by mouth at bedtime.  30 tablet  6  . RABEprazole (ACIPHEX) 20 MG tablet Take 1 tablet (20 mg total) by mouth 2 (two) times daily.  60 tablet  5  . vitamin B-12 (CYANOCOBALAMIN) 1000 MCG tablet Take 1,000 mcg by mouth daily.      . vitamin E 400 UNIT capsule Take 800 Units by mouth daily. Two capsules daily       No current facility-administered  medications for this visit.    Allergies:   No Known Allergies  Social History:  The patient  reports that he has never smoked. He has never used smokeless tobacco. He reports that he drinks alcohol. He reports that he does not use illicit drugs.   Family History:  The patient's family history includes Diabetes in his father and mother; Heart disease in his paternal grandfather and another family member; Hypertension in his brother. There is no history of Colon cancer.   ROS:  Please see the history of present illness.      All other systems reviewed and negative.   PHYSICAL EXAM: VS:  There were no vitals taken for this visit. Well nourished, well developed, in no acute distress HEENT: normal Neck: no JVD Cardiac:  normal S1, S2; RRR; no murmur Lungs:  clear to auscultation bilaterally, no wheezing, rhonchi or rales Abd: soft, nontender, no hepatomegaly Ext: no edema Skin: warm and dry Neuro:  CNs 2-12 intact, no focal abnormalities noted  ASSESSMENT AND PLAN:  1. Obesity  - I stressed the importance of getting into an exercise regimen along with his diet  2. OSA  on CPAP and tolerating well  - His download today showed an AHI of 0.6/hr on 13cm H2O and 100% compliance in using more than 4 hours nightly. - Will get a new CPAP machine 3. Atypical Chest pain  - he had a normal cath several years ago and ETT recently was normal  Followup with me in 6 months    Signed, Fransico Him, MD 10/05/2013 10:50 AM

## 2013-11-22 ENCOUNTER — Encounter (HOSPITAL_COMMUNITY): Payer: Self-pay | Admitting: Emergency Medicine

## 2013-11-22 ENCOUNTER — Emergency Department (HOSPITAL_COMMUNITY)
Admission: EM | Admit: 2013-11-22 | Discharge: 2013-11-22 | Disposition: A | Discharge status: Home / Self Care | Payer: 59 | Source: Home / Self Care | Attending: Family Medicine | Admitting: Family Medicine

## 2013-11-22 DIAGNOSIS — B86 Scabies: Secondary | ICD-10-CM

## 2013-11-22 MED ORDER — PERMETHRIN 5 % EX CREA: TOPICAL_CREAM | CUTANEOUS | Status: DC

## 2013-11-22 NOTE — ED Provider Notes (Signed)
Medical screening examination/treatment/procedure(s) were performed by a resident physician or non-physician practitioner and as the supervising physician I was immediately available for consultation/collaboration.  Linna Darner, MD Family Medicine   Waldemar Dickens, MD 11/22/13 [phone]

## 2013-11-22 NOTE — Discharge Instructions (Signed)

## 2013-11-22 NOTE — ED Provider Notes (Signed)
CSN: [id number]     Arrival date & time 11/22/13  0845 History   First MD Initiated Contact with Patient 11/22/13 [phone]     Chief Complaint  Patient presents with  . Rash   (Consider location/radiation/quality/duration/timing/severity/associated sxs/prior Treatment) HPI Comments: States he recently traveled to the beach for a fishing trip and stayed in a cabin.  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 male presenting with rash. The history is provided by the patient.  Rash Location:  Shoulder/arm and torso Shoulder/arm rash location:  L upper arm and R upper arm Torso rash location:  Lower back, R flank, L flank, abd LLQ and abd RLQ Quality: redness   Quality comment:  Papular Severity:  Moderate Onset quality:  Gradual Duration:  3 days Timing:  Constant Progression:  Worsening Chronicity:  New   Past Medical History  Diagnosis Date  . Hypercholesterolemia   . Sleep apnea   . Esophageal reflux   . Hiatal hernia   . Barrett esophagus   . Fatty liver   . Gastric polyp   . OSA (obstructive sleep apnea)   . GERD (gastroesophageal reflux disease)   . Anxiety    Past Surgical History  Procedure Laterality Date  . Colonoscopy    . Upper gastrointestinal endoscopy    . Esophageal manometry N/A 09/26/2012    Procedure: ESOPHAGEAL MANOMETRY (EM);  Surgeon: Sable Feil, MD;  Location: [facility];  Service: Endoscopy;  Laterality: N/A;  . Cardiac catheterization  2010    Normal coronary arteries by cath   Family History  Problem Relation Age of Onset  . Heart disease Paternal Grandfather   . Heart disease    . Diabetes Mother   . Diabetes Father   . Colon cancer Neg Hx   . Hypertension Brother    History  Substance Use Topics  . Smoking status: Never Smoker   . Smokeless tobacco: Never Used  . Alcohol Use: Yes     Comment: rare beer    Review of Systems  Skin: Positive for rash.  All other systems  reviewed and are negative.   Allergies  Review of patient's allergies indicates no known allergies.  Home Medications   Prior to Admission medications   Medication Sig Start Date End Date Taking? Authorizing Provider  atorvastatin (LIPITOR) 20 MG tablet Take 20 mg by mouth daily at 6 PM.  03/03/13   Historical Provider, MD  busPIRone (BUSPAR) 10 MG tablet Take 10 mg by mouth 2 (two) times daily.  03/01/13   Historical Provider, MD  metoCLOPramide (REGLAN) 10 MG tablet Take 1 tablet (10 mg total) by mouth at bedtime. 07/31/13   Jerene Bears, MD  permethrin (ELIMITE) 5 % cream Apply to affected area once. Repeat in one week 11/22/13   Audelia Hives Taimi Towe, PA  RABEprazole (ACIPHEX) 20 MG tablet Take 1 tablet (20 mg total) by mouth 2 (two) times daily. 06/15/13   Jerene Bears, MD  vitamin B-12 (CYANOCOBALAMIN) 1000 MCG tablet Take 1,000 mcg by mouth daily.    Historical Provider, MD  vitamin E 400 UNIT capsule Take 800 Units by mouth daily. Two capsules daily 04/27/13   Jerene Bears, MD   BP 122/66  Pulse 81  Temp(Src) 97.8 F (36.6 C) (Oral) Physical Exam  Nursing note and vitals reviewed. Constitutional: He is oriented to person, place, and time. He appears well-developed and well-nourished. No distress.  HENT:  Head: Normocephalic and atraumatic.  Eyes: Conjunctivae are normal. No scleral icterus.  Cardiovascular: Normal rate.   Pulmonary/Chest: Effort normal.  Musculoskeletal: Normal range of motion.  Neurological: He is alert and oriented to person, place, and time.  Skin: Skin is warm and dry. Rash noted. There is erythema.  Erythematous, papular rash on upper arms and around waistline and on lower back Individual lesions are discrete and blanchable   Psychiatric: He has a normal mood and affect. His behavior is normal.    ED Course  Procedures (including critical care time) Labs Review Labs Reviewed - No data to display  Imaging Review No results found.   MDM   1.  Scabies   Concern is for scabies. Will treat with Elimite and advise follow up if no improvement.     Lutricia Feil, [place] 11/22/13 [phone]

## 2013-11-22 NOTE — ED Notes (Signed)
C/o noted rash on trunk last PM . Returned after camping in cabins on [place]

## 2014-04-11 ENCOUNTER — Telehealth: Payer: Self-pay | Admitting: Internal Medicine

## 2014-04-11 MED ORDER — RABEPRAZOLE SODIUM 20 MG PO TBEC: 20.0000 mg | DELAYED_RELEASE_TABLET | Freq: Two times a day (BID) | ORAL | Status: DC

## 2014-04-11 NOTE — Telephone Encounter (Signed)
Rx sent. Patient needs appointment for further refills.

## 2014-06-08 ENCOUNTER — Telehealth: Payer: Self-pay | Admitting: Internal Medicine

## 2014-06-08 MED ORDER — RABEPRAZOLE SODIUM 20 MG PO TBEC: 20.0000 mg | DELAYED_RELEASE_TABLET | Freq: Two times a day (BID) | ORAL | Status: DC

## 2014-06-08 NOTE — Telephone Encounter (Signed)
Rx sent 

## 2014-07-05 ENCOUNTER — Encounter: Payer: Self-pay | Admitting: *Deleted

## 2014-08-07 ENCOUNTER — Encounter: Payer: Self-pay | Admitting: Internal Medicine

## 2014-08-07 ENCOUNTER — Ambulatory Visit (INDEPENDENT_AMBULATORY_CARE_PROVIDER_SITE_OTHER): Payer: 59 | Admitting: Internal Medicine

## 2014-08-07 VITALS — BP 138/90 | HR 80 | Ht 77.0 in | Wt 285.4 lb

## 2014-08-07 DIAGNOSIS — K760 Fatty (change of) liver, not elsewhere classified: Secondary | ICD-10-CM | POA: Diagnosis not present

## 2014-08-07 DIAGNOSIS — K824 Cholesterolosis of gallbladder: Secondary | ICD-10-CM

## 2014-08-07 DIAGNOSIS — R7309 Other abnormal glucose: Secondary | ICD-10-CM

## 2014-08-07 DIAGNOSIS — K219 Gastro-esophageal reflux disease without esophagitis: Secondary | ICD-10-CM | POA: Diagnosis not present

## 2014-08-07 DIAGNOSIS — Z1211 Encounter for screening for malignant neoplasm of colon: Secondary | ICD-10-CM

## 2014-08-07 DIAGNOSIS — K2270 Barrett's esophagus without dysplasia: Secondary | ICD-10-CM

## 2014-08-07 NOTE — Patient Instructions (Addendum)
  Continue Vitamin E, 800IU daily.  Continue your Aciphex.  Follow up with Dr Hilarie Fredrickson in a year.    I appreciate the opportunity to care for you.

## 2014-08-08 NOTE — Progress Notes (Signed)
Subjective:    Patient ID: Timothy Dixon, male    DOB: 03/20/1960, 54 y.o.   MRN: [id number]  HPI Divante Kotch is a 54 year old male with past medical history of GERD, hiatal hernia, nonalcoholic fatty liver disease, hyperlipidemia who seen in follow-up. He was last seen in June 2015. At that time he was started on oral vitamin E and we discussed the importance of low-fat diet with daily exercise and attempt at weight loss. He returns today feeling well with no new complaint. He has not been successful with increasing exercise or weight loss and he attributes this to his stressful work lifestyle. He is also busy with his teenage children. He hopes to retire in February 2017 after 30 years with [employer]. He has plans after retirement for more personal time for exercise and overall more healthy lifestyle. He is not drinking alcohol does not use tobacco. Generally feels well without fatigue. No nausea or vomiting. No trouble swallowing. Heartburn is well controlled on AcipHex 20 mg twice daily. He stopped metoclopramide previously prescribed by Dr. Sharlett Iles because it made him feel slightly tremulous. This resolved after discontinuation of the medication. He does continue oral B12 daily. Bowel movements a been regular without blood in his stool or melena. No jaundice, itching or other hepatobiliary complaint  He does report labs were done by primary care several months ago and reportedly liver enzymes had normalized. He was told his blood sugar was slightly elevated  Review of Systems As per history of present illness, otherwise negative  Current Medications, Allergies, Past Medical History, Past Surgical History, Family History and Social History were reviewed in [facility] record.     Objective:   Physical Exam BP 138/90 mmHg  Pulse 80  Ht 6\' 5"  (1.956 m)  Wt 285 lb 6.4 oz (129.457 kg)  BMI 33.84 kg/m2 Constitutional: Well-developed and well-nourished. No  distress. HEENT: Normocephalic and atraumatic. Oropharynx is clear and moist. No oropharyngeal exudate. Conjunctivae are normal.  No scleral icterus. Neck: Neck supple. Trachea midline. Cardiovascular: Normal rate, regular rhythm and intact distal pulses. No M/R/G Pulmonary/chest: Effort normal and breath sounds normal. No wheezing, rales or rhonchi. Abdominal: Soft, nontender, nondistended. Bowel sounds active throughout. There are no masses palpable. No hepatosplenomegaly. Extremities: no clubbing, cyanosis, or edema Lymphadenopathy: No cervical adenopathy noted. Neurological: Alert and oriented to person place and time. Skin: Skin is warm and dry. No rashes noted. Psychiatric: Normal mood and affect. Behavior is normal.  Labs reviewed dated 04/05/2014 Hemoglobin A1c 6.4 AST 15, ALT 20, T bili 0.7, alkaline phosphatase 89 Albumen 4.6, total protein 7.0, BUN 20, creatinine 1.29 Glucose 117 PSA normal  Ultrasound from 2015 reviewed -- fatty liver changes an area of focal fatty sparing., Minimally changed gallbladder polyp compared to 2010      Assessment & Plan:  54 year old male with GERD, hiatal hernia, nonalcoholic fatty liver disease in the setting of metabolic syndrome  1. Nonalcoholic steatohepatitis -- previous. Had slight elevation in serum transaminases, this has resolved on oral vitamin E which is reassuring. He has not been successful at increasing diet and exercise but he remains hopeful he will be able to do so. I'm encouraged that he normalized enzymes even despite weight loss. We discussed the metabolic syndrome and how fatty liver is often found in patients with elevated blood pressure, blood sugar and cholesterol. Diet and exercise encouraged along with monitoring improvement is much as medically possible for blood pressure, cholesterol and blood glucose. Continue vitamin  E 800 international units daily  2. GERD -- well-controlled without alarm symptoms. Continue AcipHex  20 mg twice a day. Metoclopramide has been discontinued which I think is  a good thing  3. Gallbladder polyp  -- subcentimeter stable over 5 years, no further follow-up necessary   4. CRC screening  -- repeat colonoscopy recommended May 2018   Return in one year, sooner if necessary  25 minutes spent with the patient

## 2014-09-01 ENCOUNTER — Other Ambulatory Visit: Payer: Self-pay | Admitting: Internal Medicine

## 2014-09-17 ENCOUNTER — Telehealth: Payer: Self-pay | Admitting: Internal Medicine

## 2014-09-17 ENCOUNTER — Telehealth: Payer: Self-pay | Admitting: Emergency Medicine

## 2014-09-17 MED ORDER — RABEPRAZOLE SODIUM 20 MG PO TBEC: DELAYED_RELEASE_TABLET | ORAL | Status: DC

## 2014-09-17 NOTE — Telephone Encounter (Signed)
Rx sent 

## 2014-09-17 NOTE — Telephone Encounter (Signed)
error 

## 2014-10-02 ENCOUNTER — Telehealth: Payer: Self-pay | Admitting: Internal Medicine

## 2014-10-02 NOTE — Telephone Encounter (Signed)
Please explained to the patient that insurance is no longer allowing AcipHex Try pantoprazole 40 mg twice a day before meals

## 2014-10-02 NOTE — Telephone Encounter (Signed)
Dr Hilarie Fredrickson- Insurance has denied our prior auth request for aciphex twice daily dosing for patient's Barrett's esophagus. He has previously tried Financial controller. Do you have a preference for PPI for patient since he has Barrett's? I have attempted to call patient regarding this but his voicemail states that "memory is full."

## 2014-10-03 NOTE — Telephone Encounter (Signed)
Left message for patient to call back  

## 2014-10-05 NOTE — Telephone Encounter (Signed)
Pt called back and states that the reason it was not approved was because the insurance company states it was sent in as name brand, not generic. Called [phone] requested urgent prior-auth. Aciphex approved [id number]. Shannon notified, script went through and they are calling the pt to let him know.

## 2014-10-05 NOTE — Telephone Encounter (Signed)
Pt called and states that he has been trying for weeks to get his aciphex. Explained to pt that his insurance company will not cover the aciphex. Per Dr. Hilarie Fredrickson pt can try protonix bid. Pt states he has had this in the past and it did not work. Let pt know we had attempted to reach him several times and left messages to call back and had not heard from him. Pt states he has never gotten a message. States he thinks there is a problem in communication between [employer] and our office and he is stuck in the middle. Pt wanted to know if there is a phone number he could call to discuss with his insurance company, let pt know there should be a number on his insurance card to call. Pt requests to have Dottie call him on his cell phone Monday and leave him a message.

## 2014-10-05 NOTE — Telephone Encounter (Signed)
Left message for patient to call back  

## 2014-10-19 ENCOUNTER — Other Ambulatory Visit: Payer: Self-pay | Admitting: *Deleted

## 2014-10-19 MED ORDER — RABEPRAZOLE SODIUM 20 MG PO TBEC: DELAYED_RELEASE_TABLET | ORAL | Status: DC

## 2014-10-19 NOTE — Telephone Encounter (Signed)
Received an rx request from [facility] for patient's aciphex. I have allowed for 1 year of 90 day supply to be sent to pharmacy. I have also called to cancel 30 day rx's at [facility] in [place] (s/w Loma Messing)

## 2014-11-16 ENCOUNTER — Telehealth: Payer: Self-pay | Admitting: Cardiology

## 2014-11-16 NOTE — Telephone Encounter (Signed)
New Message  [facility] is calling to see if we received the statement of medical necessity. Faxed on the 24th of aug. Will refax.

## 2015-08-08 ENCOUNTER — Encounter: Payer: Self-pay | Admitting: Internal Medicine

## 2015-12-06 ENCOUNTER — Other Ambulatory Visit: Payer: Self-pay | Admitting: Internal Medicine

## 2015-12-09 ENCOUNTER — Telehealth: Payer: Self-pay | Admitting: Internal Medicine

## 2015-12-09 MED ORDER — RABEPRAZOLE SODIUM 20 MG PO TBEC: DELAYED_RELEASE_TABLET | ORAL | 0 refills | Status: DC

## 2015-12-09 NOTE — Telephone Encounter (Signed)
Rx sent to [facility] until upcoming appointment.

## 2015-12-10 NOTE — Telephone Encounter (Signed)
Prior auth forms filled out and returned to [facility].

## 2016-01-15 ENCOUNTER — Telehealth: Payer: Self-pay | Admitting: Internal Medicine

## 2016-01-15 NOTE — Telephone Encounter (Signed)
I spoke to Nibbe at [facility] and advised that we sent PA forms back to them on 12-10-15 @ 10:51 am. He states that they do not have this on file. I asked for telephone PA and was given an approval for twice daily rabeprazole. He states that insurance will approve only 30 days at a time, not 90 days. I have left a message for patient to call back.

## 2016-01-16 MED ORDER — RABEPRAZOLE SODIUM 20 MG PO TBEC: DELAYED_RELEASE_TABLET | ORAL | 0 refills | Status: DC

## 2016-01-16 NOTE — Telephone Encounter (Signed)
I spoke to patient and apologized for the delay in him being able to get his medication. I advised that we did sent PA forms to insurance on 12/10/15 but insurance states they never received it. I advised that medication was approved but per insurance only for 30 days at a time. He states that he would prefer we send the Aciphex to [facility] still even if only getting 30 days at a time. Rx sent. He will call back if [facility] is unable to send only 30 day script.

## 2016-01-17 NOTE — Telephone Encounter (Signed)
[id number]. Coverage good until 01/14/17.

## 2016-02-19 ENCOUNTER — Ambulatory Visit (INDEPENDENT_AMBULATORY_CARE_PROVIDER_SITE_OTHER): Payer: Commercial Managed Care - HMO | Admitting: Internal Medicine

## 2016-02-19 ENCOUNTER — Encounter: Payer: Self-pay | Admitting: Internal Medicine

## 2016-02-19 VITALS — BP 124/72 | HR 88 | Ht 77.75 in | Wt 266.5 lb

## 2016-02-19 DIAGNOSIS — K760 Fatty (change of) liver, not elsewhere classified: Secondary | ICD-10-CM

## 2016-02-19 DIAGNOSIS — K219 Gastro-esophageal reflux disease without esophagitis: Secondary | ICD-10-CM | POA: Diagnosis not present

## 2016-02-19 DIAGNOSIS — Z1211 Encounter for screening for malignant neoplasm of colon: Secondary | ICD-10-CM | POA: Diagnosis not present

## 2016-02-19 MED ORDER — RABEPRAZOLE SODIUM 20 MG PO TBEC: DELAYED_RELEASE_TABLET | ORAL | 3 refills | Status: DC

## 2016-02-19 MED ORDER — NA SULFATE-K SULFATE-MG SULF 17.5-3.13-1.6 GM/177ML PO SOLN: ORAL | 0 refills | Status: DC

## 2016-02-19 NOTE — Patient Instructions (Signed)
We have sent the following prescriptions to your mail in pharmacy: Aciphex twice daily  If you have not heard from your mail in pharmacy within 1 week or if you have not received your medication in the mail, please contact us at [phone] so we may find out why.  You have been scheduled for an endoscopy and colonoscopy. Please follow the written instructions given to you at your visit today. Please pick up your prep supplies at the pharmacy within the next 1-3 days. If you use inhalers (even only as needed), please bring them with you on the day of your procedure. Your physician has requested that you go to www.startemmi.com and enter the access code given to you at your visit today. This web site gives a general overview about your procedure. However, you should still follow specific instructions given to you by our office regarding your preparation for the procedure.  If you are age 54 or older, your body mass index should be between 23-30. Your Body mass index is 31 kg/m. If this is out of the aforementioned range listed, please consider follow up with your Primary Care Provider.  If you are age 67 or younger, your body mass index should be between 19-25. Your Body mass index is 31 kg/m. If this is out of the aformentioned range listed, please consider follow up with your Primary Care Provider.

## 2016-02-19 NOTE — Progress Notes (Signed)
Subjective:    Patient ID: QUANTEZ MAZANEC, male    DOB: [birth year], 56 y.o.   MRN: [id number]  HPI Mckyle Tortora is a 56 year old male with a history of GERD, hiatal hernia, nonalcoholic fatty liver disease, hyperlipidemia who is here for follow-up. He was last seen in June 2016. He's been maintained on vitamin E for fatty liver disease. He reports recently he has been doing very well. He did retire as expected in March 2017. 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 This is much less stressful for him. His family is doing well and his daughter is a second year at [employer] in the premed program  He reports his reflux is well controlled as long as he continues AcipHex 20 mg twice daily. He denies dysphagia and odynophagia. No abdominal pain. Bowel movements a been regular without blood in his stool or melena. He is interested in screening colonoscopy as he has had several friends with colon cancer.  His last colonoscopy was performed by Dr. Sharlett Iles on 07/01/2011. This prep was documented as "poor". The exam was otherwise normal.  Last EGD was on 09/09/2012. He had a possible area of Barrett's esophagus which was biopsied. Hiatal hernia was seen. Dilation with [place] was performed. He had multiple gastric polyps which were biopsied and found to be fundic gland polyps without H. pylori. GE junction biopsies were negative for metaplasia. Examined duodenum was unremarkable   Review of Systems As per history of present illness, otherwise negative  Current Medications, Allergies, Past Medical History, Past Surgical History, Family History and Social History were reviewed in [employer] record.     Objective:   Physical Exam BP 124/72 (BP Location: Left Arm, Patient Position: Sitting, Cuff Size: Normal)   Pulse 88   Ht 6' 5.75" (1.975 m)   Wt 266 lb 8 oz (120.9 kg)    BMI 31.00 kg/m  Constitutional: Well-developed and well-nourished. No distress. HEENT: Normocephalic and atraumatic.Conjunctivae are normal.  No scleral icterus. Neck: Neck supple. Trachea midline. Cardiovascular: Normal rate, regular rhythm and intact distal pulses. No M/R/G Pulmonary/chest: Effort normal and breath sounds normal. No wheezing, rales or rhonchi. Abdominal: Soft, nontender, nondistended. Bowel sounds active throughout. There are no masses palpable. No hepatosplenomegaly. Extremities: no clubbing, cyanosis, or edema Neurological: Alert and oriented to person place and time. Skin: Skin is warm and dry.  Psychiatric: Normal mood and affect. Behavior is normal.  .Lab work from primary care reviewed dated 08/08/2015 -- CMP within normal limits except for mildly elevated glucose at 109. AST 16, ALT 17, alkaline phosphatase 76, total bili 0.9 Albumin 4.6 Creatinine 1.13 Hemoglobin A1c 5.8     Assessment & Plan:  56 year old male with a history of GERD, hiatal hernia, nonalcoholic fatty liver disease, hyperlipidemia who is here for follow-up.  1. Nonalcoholic fatty liver -- he has been successful at losing nearly 25 pounds since retiring. He's also continue vitamin E therapy. He's had complete normalization of his liver enzymes which is great news. I recommended he continue diet and exercise program. Continue vitamin E 800 international units daily. Liver enzymes can be followed per routine at this point through primary care. Certainly no evidence of advanced liver disease and if no evidence of liver inflammation going forward risk for progression to advanced liver disease is low.  2. GERD -- stable on AcipHex 20 mg twice a day.  Attempt to wean to once a day has resulted in breakthrough symptoms. Irregular Z line and 2014 the biopsies negative for metaplasia. I recommended repeat upper endoscopy to screen for Barrett's given chronic reflux in this middle-aged white male. If negative  he should not need further screening as long as reflux remains controlled.  3. CRC screening -- poor prep 5 years ago. Repeat screening colonoscopy recommended at this time. We discussed the risk of benefits and alternatives and he wishes to proceed with upper endoscopy and colonoscopy  25 minutes spent with the patient today. Greater than 50% was spent in counseling and coordination of care with the patient

## 2016-03-26 ENCOUNTER — Encounter: Payer: Self-pay | Admitting: Internal Medicine

## 2016-04-09 ENCOUNTER — Encounter: Payer: Self-pay | Admitting: Internal Medicine

## 2016-04-09 ENCOUNTER — Ambulatory Visit (AMBULATORY_SURGERY_CENTER): Payer: Commercial Managed Care - HMO | Admitting: Internal Medicine

## 2016-04-09 VITALS — BP 111/69 | HR 72 | Temp 97.3°F | Resp 15 | Ht 77.75 in | Wt 266.0 lb

## 2016-04-09 DIAGNOSIS — K621 Rectal polyp: Secondary | ICD-10-CM | POA: Diagnosis not present

## 2016-04-09 DIAGNOSIS — Z1211 Encounter for screening for malignant neoplasm of colon: Secondary | ICD-10-CM | POA: Diagnosis not present

## 2016-04-09 DIAGNOSIS — K317 Polyp of stomach and duodenum: Secondary | ICD-10-CM | POA: Diagnosis not present

## 2016-04-09 DIAGNOSIS — K219 Gastro-esophageal reflux disease without esophagitis: Secondary | ICD-10-CM

## 2016-04-09 DIAGNOSIS — R1310 Dysphagia, unspecified: Secondary | ICD-10-CM

## 2016-04-09 DIAGNOSIS — D128 Benign neoplasm of rectum: Secondary | ICD-10-CM

## 2016-04-09 DIAGNOSIS — Z1212 Encounter for screening for malignant neoplasm of rectum: Secondary | ICD-10-CM

## 2016-04-09 MED ORDER — SODIUM CHLORIDE 0.9 % IV SOLN: 500.0000 mL | INTRAVENOUS | Status: DC

## 2016-04-09 NOTE — Progress Notes (Signed)
OPA inserted d/t a/w obstruction. Good a/w after, stable sats

## 2016-04-09 NOTE — Progress Notes (Signed)
To recovery, report to Jones, RN, VSS 

## 2016-04-09 NOTE — Patient Instructions (Signed)
YOU HAD AN ENDOSCOPIC PROCEDURE TODAY AT [facility]:   Refer to the procedure report that was given to you for any specific questions about what was found during the examination.  If the procedure report does not answer your questions, please call your gastroenterologist to clarify.  If you requested that your care partner not be given the details of your procedure findings, then the procedure report has been included in a sealed envelope for you to review at your convenience later.  YOU SHOULD EXPECT: Some feelings of bloating in the abdomen. Passage of more gas than usual.  Walking can help get rid of the air that was put into your GI tract during the procedure and reduce the bloating. If you had a lower endoscopy (such as a colonoscopy or flexible sigmoidoscopy) you may notice spotting of blood in your stool or on the toilet paper. If you underwent a bowel prep for your procedure, you may not have a normal bowel movement for a few days.  Please Note:  You might notice some irritation and congestion in your nose or some drainage.  This is from the oxygen used during your procedure.  There is no need for concern and it should clear up in a day or so.  SYMPTOMS TO REPORT IMMEDIATELY:   Following lower endoscopy (colonoscopy or flexible sigmoidoscopy):  Excessive amounts of blood in the stool  Significant tenderness or worsening of abdominal pains  Swelling of the abdomen that is new, acute  Fever of 100F or higher   Following upper endoscopy (EGD)  Vomiting of blood or coffee ground material  New chest pain or pain under the shoulder blades  Painful or persistently difficult swallowing  New shortness of breath  Fever of 100F or higher  Black, tarry-looking stools  For urgent or emergent issues, a gastroenterologist can be reached at any hour by calling [phone].   DIET:  Follow Post Esophageal Dilation Diet..  Drink plenty of fluids but you should avoid alcoholic  beverages for 24 hours.  ACTIVITY:  You should plan to take it easy for the rest of today and you should NOT DRIVE or use heavy machinery until tomorrow (because of the sedation medicines used during the test).    FOLLOW UP: Our staff will call the number listed on your records the next business day following your procedure to check on you and address any questions or concerns that you may have regarding the information given to you following your procedure. If we do not reach you, we will leave a message.  However, if you are feeling well and you are not experiencing any problems, there is no need to return our call.  We will assume that you have returned to your regular daily activities without incident.  If any biopsies were taken you will be contacted by phone or by letter within the next 1-3 weeks.  Please call us at [phone] if you have not heard about the biopsies in 3 weeks.    Await for biopsy results to determined next repeat Colonoscopy Esophagitis and Stricture Dilation (handout given) Post Esophageal Dilation Diet (handout given) Polyps (handout given) Hiatal Hernia (handout given)   SIGNATURES/CONFIDENTIALITY: You and/or your care partner have signed paperwork which will be entered into your electronic medical record.  These signatures attest to the fact that that the information above on your After Visit Summary has been reviewed and is understood.  Full responsibility of the confidentiality of this discharge information  lies with you and/or your care-partner.

## 2016-04-09 NOTE — Op Note (Signed)
[place] Patient Name: Timothy Dixon Procedure Date: 04/09/2016 1:29 PM MRN: [id number] Endoscopist: Jerene Bears , MD Age: 56 Referring MD:  Date of Birth: 25-Feb-1960 Gender: Male Account #: [id number] Procedure:                Upper GI endoscopy Indications:              Gastro-esophageal reflux disease, pill dysphagia                            previously responsive to esophageal dilation Medicines:                Monitored Anesthesia Care Procedure:                Pre-Anesthesia Assessment:                           - Prior to the procedure, a History and Physical                            was performed, and patient medications and                            allergies were reviewed. The patient's tolerance of                            previous anesthesia was also reviewed. The risks                            and benefits of the procedure and the sedation                            options and risks were discussed with the patient.                            All questions were answered, and informed consent                            was obtained. Prior Anticoagulants: The patient has                            taken no previous anticoagulant or antiplatelet                            agents. ASA Grade Assessment: II - A patient with                            mild systemic disease. After reviewing the risks                            and benefits, the patient was deemed in                            satisfactory condition to undergo the procedure.  After obtaining informed consent, the endoscope was                            passed under direct vision. Throughout the                            procedure, the patient's blood pressure, pulse, and                            oxygen saturations were monitored continuously. The                            Model GIF-HQ190 [id number]) scope was introduced                            through the  mouth, and advanced to the second part                            of duodenum. The upper GI endoscopy was                            accomplished without difficulty. The patient                            tolerated the procedure well. Scope In: Scope Out: Findings:                 The Z-line was irregular and was found 39 cm from                            the incisors. There was an [place] of salmon-colored                            mucosa 0.5 cm above the Z-line. Several biopsies                            were obtained with cold forceps for evaluation to                            rule out Barrett's Esophagus in a targeted manner                            in the distal esophagus.                           A 2 cm hiatal hernia was present.                           No endoscopic abnormality was evident in the                            esophagus to explain the patient's complaint of  dysphagia. A guidewire was placed and the scope was                            withdrawn. Dilation was performed with a Savary                            dilator with mild resistance at 17 mm.                           Multiple 3 to 15 mm pedunculated and sessile polyps                            with no bleeding and no stigmata of recent bleeding                            were found in the cardia, in the gastric fundus and                            in the gastric body. This was biopsied with a cold                            forceps for histology.                           The gastric antrum and pylorus were normal.                           The examined duodenum was normal. Complications:            No immediate complications. Estimated Blood Loss:     Estimated blood loss was minimal. Impression:               - Z-line irregular, 39 cm from the incisors.                            Biopsied.                           - 2 cm hiatal hernia.                           -  Esophageal dilation to 17 mm with Savary over                            guidewire.                           - Multiple gastric polyps. Biopsied.                           - Normal antrum and pylorus.                           - Normal examined duodenum. Recommendation:           - Patient has a contact number available for  emergencies. The signs and symptoms of potential                            delayed complications were discussed with the                            patient. Return to normal activities tomorrow.                            Written discharge instructions were provided to the                            patient.                           - Resume previous diet.                           - Continue present medications.                           - Await pathology results. Jerene Bears, MD 04/09/2016 2:42:07 PM This report has been signed electronically.

## 2016-04-09 NOTE — Op Note (Signed)
[place] Patient Name: Timothy Dixon Procedure Date: 04/09/2016 1:28 PM MRN: [id number] Endoscopist: Jerene Bears , MD Age: 56 Referring MD:  Date of Birth: 1960-11-20 Gender: Male Account #: [id number] Procedure:                Colonoscopy Indications:              Screening for colorectal malignant neoplasm Medicines:                Monitored Anesthesia Care Procedure:                Pre-Anesthesia Assessment:                           - Prior to the procedure, a History and Physical                            was performed, and patient medications and                            allergies were reviewed. The patient's tolerance of                            previous anesthesia was also reviewed. The risks                            and benefits of the procedure and the sedation                            options and risks were discussed with the patient.                            All questions were answered, and informed consent                            was obtained. Prior Anticoagulants: The patient has                            taken no previous anticoagulant or antiplatelet                            agents. ASA Grade Assessment: II - A patient with                            mild systemic disease. After reviewing the risks                            and benefits, the patient was deemed in                            satisfactory condition to undergo the procedure.                           After obtaining informed consent, the colonoscope  was passed under direct vision. Throughout the                            procedure, the patient's blood pressure, pulse, and                            oxygen saturations were monitored continuously. The                            Model CF-HQ190L [id number]) scope was introduced                            through the anus and advanced to the the cecum,                            identified by  appendiceal orifice and ileocecal                            valve. The colonoscopy was performed without                            difficulty. The patient tolerated the procedure                            well. The quality of the bowel preparation was                            good. The ileocecal valve, appendiceal orifice, and                            rectum were photographed. The bowel preparation                            used was 2 day SuPrep. Scope In: 2:10:33 PM Scope Out: 2:28:54 PM Scope Withdrawal Time: 0 hours 13 minutes 17 seconds  Total Procedure Duration: 0 hours 18 minutes 21 seconds  Findings:                 The perianal and digital rectal examinations were                            normal.                           A 3 mm polyp was found in the rectum. The polyp was                            sessile. The polyp was removed with a cold biopsy                            forceps. Resection and retrieval were complete.                           The exam was otherwise without abnormality on  direct and retroflexion views. Complications:            No immediate complications. Estimated Blood Loss:     Estimated blood loss: none. Impression:               - One 3 mm polyp in the rectum, removed with a cold                            biopsy forceps. Resected and retrieved.                           - The examination was otherwise normal on direct                            and retroflexion views. Recommendation:           - Patient has a contact number available for                            emergencies. The signs and symptoms of potential                            delayed complications were discussed with the                            patient. Return to normal activities tomorrow.                            Written discharge instructions were provided to the                            patient.                           - Resume previous  diet.                           - Continue present medications.                           - Await pathology results.                           - Repeat colonoscopy is recommended. The                            colonoscopy date will be determined after pathology                            results from today's exam become available for                            review. Jerene Bears, MD 04/09/2016 2:43:58 PM This report has been signed electronically.

## 2016-04-10 ENCOUNTER — Telehealth: Payer: Self-pay

## 2016-04-10 DIAGNOSIS — Z7984 Long term (current) use of oral hypoglycemic drugs: Secondary | ICD-10-CM | POA: Diagnosis not present

## 2016-04-10 DIAGNOSIS — E785 Hyperlipidemia, unspecified: Secondary | ICD-10-CM | POA: Diagnosis not present

## 2016-04-10 DIAGNOSIS — E119 Type 2 diabetes mellitus without complications: Secondary | ICD-10-CM | POA: Diagnosis not present

## 2016-04-10 DIAGNOSIS — Z0000 Encounter for general adult medical examination without abnormal findings: Secondary | ICD-10-CM | POA: Diagnosis not present

## 2016-04-10 DIAGNOSIS — Z125 Encounter for screening for malignant neoplasm of prostate: Secondary | ICD-10-CM | POA: Diagnosis not present

## 2016-04-10 NOTE — Telephone Encounter (Signed)
  Follow up Call-  Call back number 04/09/2016  Post procedure Call Back phone  # [phone]  Permission to leave phone message Yes  Some recent data might be hidden     Patient questions:  Do you have a fever, pain , or abdominal swelling? No. Pain Score  0 *  Have you tolerated food without any problems? Yes.    Have you been able to return to your normal activities? Yes.    Do you have any questions about your discharge instructions: Diet   No. Medications  No. Follow up visit  No.  Do you have questions or concerns about your Care? No.  Actions: * If pain score is 4 or above: No action needed, pain <4.

## 2016-04-13 DIAGNOSIS — G4733 Obstructive sleep apnea (adult) (pediatric): Secondary | ICD-10-CM | POA: Diagnosis not present

## 2016-04-15 ENCOUNTER — Encounter: Payer: Self-pay | Admitting: Internal Medicine

## 2016-04-26 DIAGNOSIS — M545 Low back pain: Secondary | ICD-10-CM | POA: Diagnosis not present

## 2016-05-11 DIAGNOSIS — M545 Low back pain: Secondary | ICD-10-CM | POA: Diagnosis not present

## 2016-05-14 DIAGNOSIS — M545 Low back pain: Secondary | ICD-10-CM | POA: Diagnosis not present

## 2016-05-14 DIAGNOSIS — M62830 Muscle spasm of back: Secondary | ICD-10-CM | POA: Diagnosis not present

## 2016-05-19 DIAGNOSIS — M545 Low back pain: Secondary | ICD-10-CM | POA: Diagnosis not present

## 2016-05-19 DIAGNOSIS — M62830 Muscle spasm of back: Secondary | ICD-10-CM | POA: Diagnosis not present

## 2016-05-26 DIAGNOSIS — M62830 Muscle spasm of back: Secondary | ICD-10-CM | POA: Diagnosis not present

## 2016-05-26 DIAGNOSIS — M545 Low back pain: Secondary | ICD-10-CM | POA: Diagnosis not present

## 2016-05-29 DIAGNOSIS — M62830 Muscle spasm of back: Secondary | ICD-10-CM | POA: Diagnosis not present

## 2016-05-29 DIAGNOSIS — M545 Low back pain: Secondary | ICD-10-CM | POA: Diagnosis not present

## 2016-06-01 DIAGNOSIS — M62830 Muscle spasm of back: Secondary | ICD-10-CM | POA: Diagnosis not present

## 2016-06-01 DIAGNOSIS — M545 Low back pain: Secondary | ICD-10-CM | POA: Diagnosis not present

## 2016-06-03 DIAGNOSIS — M62830 Muscle spasm of back: Secondary | ICD-10-CM | POA: Diagnosis not present

## 2016-06-03 DIAGNOSIS — M545 Low back pain: Secondary | ICD-10-CM | POA: Diagnosis not present

## 2016-06-09 DIAGNOSIS — M545 Low back pain: Secondary | ICD-10-CM | POA: Diagnosis not present

## 2016-06-09 DIAGNOSIS — M62830 Muscle spasm of back: Secondary | ICD-10-CM | POA: Diagnosis not present

## 2016-06-11 DIAGNOSIS — M545 Low back pain: Secondary | ICD-10-CM | POA: Diagnosis not present

## 2016-06-11 DIAGNOSIS — M62830 Muscle spasm of back: Secondary | ICD-10-CM | POA: Diagnosis not present

## 2016-06-16 DIAGNOSIS — M62830 Muscle spasm of back: Secondary | ICD-10-CM | POA: Diagnosis not present

## 2016-06-16 DIAGNOSIS — M545 Low back pain: Secondary | ICD-10-CM | POA: Diagnosis not present

## 2016-08-31 DIAGNOSIS — G4733 Obstructive sleep apnea (adult) (pediatric): Secondary | ICD-10-CM | POA: Diagnosis not present

## 2016-09-15 DIAGNOSIS — H01021 Squamous blepharitis right upper eyelid: Secondary | ICD-10-CM | POA: Diagnosis not present

## 2016-09-15 DIAGNOSIS — E119 Type 2 diabetes mellitus without complications: Secondary | ICD-10-CM | POA: Diagnosis not present

## 2016-09-15 DIAGNOSIS — H01022 Squamous blepharitis right lower eyelid: Secondary | ICD-10-CM | POA: Diagnosis not present

## 2016-10-08 DIAGNOSIS — E785 Hyperlipidemia, unspecified: Secondary | ICD-10-CM | POA: Diagnosis not present

## 2016-10-08 DIAGNOSIS — E119 Type 2 diabetes mellitus without complications: Secondary | ICD-10-CM | POA: Diagnosis not present

## 2016-10-08 DIAGNOSIS — Z23 Encounter for immunization: Secondary | ICD-10-CM | POA: Diagnosis not present

## 2016-10-26 DIAGNOSIS — G4733 Obstructive sleep apnea (adult) (pediatric): Secondary | ICD-10-CM | POA: Diagnosis not present

## 2016-11-25 DIAGNOSIS — G4733 Obstructive sleep apnea (adult) (pediatric): Secondary | ICD-10-CM | POA: Diagnosis not present

## 2017-02-13 DIAGNOSIS — G4733 Obstructive sleep apnea (adult) (pediatric): Secondary | ICD-10-CM | POA: Diagnosis not present

## 2017-03-01 DIAGNOSIS — G4733 Obstructive sleep apnea (adult) (pediatric): Secondary | ICD-10-CM | POA: Diagnosis not present

## 2017-03-09 ENCOUNTER — Other Ambulatory Visit: Payer: Self-pay | Admitting: Internal Medicine

## 2017-03-15 DIAGNOSIS — G4733 Obstructive sleep apnea (adult) (pediatric): Secondary | ICD-10-CM | POA: Diagnosis not present

## 2017-03-17 ENCOUNTER — Telehealth: Payer: Self-pay | Admitting: *Deleted

## 2017-03-17 NOTE — Telephone Encounter (Signed)
[employer] has approved Rabeprazole until 03/13/2018. [id number].

## 2017-03-29 DIAGNOSIS — D22112 Melanocytic nevi of right lower eyelid, including canthus: Secondary | ICD-10-CM | POA: Diagnosis not present

## 2017-03-29 DIAGNOSIS — H2513 Age-related nuclear cataract, bilateral: Secondary | ICD-10-CM | POA: Diagnosis not present

## 2017-03-29 DIAGNOSIS — H11153 Pinguecula, bilateral: Secondary | ICD-10-CM | POA: Diagnosis not present

## 2017-04-14 DIAGNOSIS — G4733 Obstructive sleep apnea (adult) (pediatric): Secondary | ICD-10-CM | POA: Diagnosis not present

## 2017-05-14 DIAGNOSIS — G4733 Obstructive sleep apnea (adult) (pediatric): Secondary | ICD-10-CM | POA: Diagnosis not present

## 2017-05-20 DIAGNOSIS — Z0000 Encounter for general adult medical examination without abnormal findings: Secondary | ICD-10-CM | POA: Diagnosis not present

## 2017-05-20 DIAGNOSIS — Z125 Encounter for screening for malignant neoplasm of prostate: Secondary | ICD-10-CM | POA: Diagnosis not present

## 2017-05-20 DIAGNOSIS — E785 Hyperlipidemia, unspecified: Secondary | ICD-10-CM | POA: Diagnosis not present

## 2017-05-20 DIAGNOSIS — E119 Type 2 diabetes mellitus without complications: Secondary | ICD-10-CM | POA: Diagnosis not present

## 2017-06-13 DIAGNOSIS — G4733 Obstructive sleep apnea (adult) (pediatric): Secondary | ICD-10-CM | POA: Diagnosis not present

## 2017-06-15 DIAGNOSIS — G4733 Obstructive sleep apnea (adult) (pediatric): Secondary | ICD-10-CM | POA: Diagnosis not present

## 2017-06-28 ENCOUNTER — Ambulatory Visit (HOSPITAL_COMMUNITY)
Admission: EM | Admit: 2017-06-28 | Discharge: 2017-06-28 | Disposition: A | Discharge status: Home / Self Care | Payer: 59 | Attending: Family Medicine | Admitting: Family Medicine

## 2017-06-28 ENCOUNTER — Encounter (HOSPITAL_COMMUNITY): Payer: Self-pay | Admitting: Emergency Medicine

## 2017-06-28 ENCOUNTER — Other Ambulatory Visit: Payer: Self-pay

## 2017-06-28 DIAGNOSIS — H8111 Benign paroxysmal vertigo, right ear: Secondary | ICD-10-CM | POA: Diagnosis not present

## 2017-06-28 MED ORDER — MECLIZINE HCL 25 MG PO TABS: 25.0000 mg | ORAL_TABLET | Freq: Three times a day (TID) | ORAL | 0 refills | Status: DC | PRN

## 2017-06-28 NOTE — ED Triage Notes (Signed)
The patient presented to the [facility] with a complaint of off and on dizziness x 1 week. The patient reported that the symptoms have worsened over the last 3 days. The patient reported a hx of vertigo.

## 2017-06-28 NOTE — Discharge Instructions (Addendum)
Rest and push fluids Recommend OTC zyrtec as needed for symptomatic relief Meclizine prescribed.  Take as directed and as needed for symptomatic relief Referral to ENT Return or go to ER if you have any new  or worsening symptoms

## 2017-06-28 NOTE — ED Provider Notes (Signed)
[facility]   [id number] 06/28/17 Arrival Time: 1696  SUBJECTIVE: History from: patient.  Timothy Dixon is a 57 y.o. male who complains of dizziness for 1 week that has progressively worsened within the last few days.  He denies a precipitating event, or trauma.  He describes the dizziness as constant light-headedness intermittent episodes lasting  30 seconds of room spinning.  He has tried dix hallpike maneuvers and sitting with his eyes closed with temporary relief.  His symptoms are made worse with sudden movements.   He admits to previous symptoms and was diagnosed with vertigo in the past that improved with meclizine.  He complains of nausea, and ear fullness.  He denies fever, chills, chest pain, SOB, fainting episode, head trauma, weakness, numbness, slurred speech, abdominal pain, changes in bowel or bladder habits.    ROS: As per HPI.  Past Medical History:  Diagnosis Date  . Anxiety   . Barrett esophagus   . Elevated LFTs   . Esophageal reflux   . Fatty liver   . Gallbladder polyp   . Gastric polyp   . GERD (gastroesophageal reflux disease)   . Hepatic steatosis   . Hiatal hernia   . Hypercholesterolemia   . OSA (obstructive sleep apnea)   . Sleep apnea    Past Surgical History:  Procedure Laterality Date  . CARDIAC CATHETERIZATION  2010   Normal coronary arteries by cath  . COLONOSCOPY    . ESOPHAGEAL MANOMETRY N/A 09/26/2012   Procedure: ESOPHAGEAL MANOMETRY (EM);  Surgeon: Sable Feil, MD;  Location: [facility];  Service: Endoscopy;  Laterality: N/A;  . UPPER GASTROINTESTINAL ENDOSCOPY     No Known Allergies Current Facility-Administered Medications on File Prior to Encounter  Medication Dose Route Frequency Provider Last Rate Last Dose  . 0.9 %  sodium chloride infusion  500 mL Intravenous Continuous Pyrtle, Lajuan Lines, MD       Current Outpatient Medications on File Prior to Encounter  Medication Sig Dispense Refill  . atorvastatin  (LIPITOR) 20 MG tablet Take 20 mg by mouth daily at 6 PM.     . busPIRone (BUSPAR) 10 MG tablet Take 10 mg by mouth 2 (two) times daily.     . metFORMIN (GLUCOPHAGE) 500 MG tablet Take 1 tablet by mouth 2 (two) times daily with a meal.    . RABEprazole (ACIPHEX) 20 MG tablet TAKE 1 TABLET BY MOUTH  TWICE A DAY 180 tablet 3  . vitamin B-12 (CYANOCOBALAMIN) 1000 MCG tablet Take 1,000 mcg by mouth daily.    . vitamin E 400 UNIT capsule Take 800 Units by mouth daily. Two capsules daily     Social History   Socioeconomic History  . Marital status: Married    Spouse name: Not on file  . Number of children: Not on file  . Years of education: Not on file  . Highest education level: Not on file  Occupational History  . Occupation: Office manager: UNEMPLOYED  Social Needs  . Financial resource strain: Not on file  . Food insecurity:    Worry: Not on file    Inability: Not on file  . Transportation needs:    Medical: Not on file    Non-medical: Not on file  Tobacco Use  . Smoking status: Never Smoker  . Smokeless tobacco: Never Used  Substance and Sexual Activity  . Alcohol use: Yes    Comment: rare beer  . Drug use: No  .  Sexual activity: Not on file  Lifestyle  . Physical activity:    Days per week: Not on file    Minutes per session: Not on file  . Stress: Not on file  Relationships  . Social connections:    Talks on phone: Not on file    Gets together: Not on file    Attends religious service: Not on file    Active member of club or organization: Not on file    Attends meetings of clubs or organizations: Not on file    Relationship status: Not on file  . Intimate partner violence:    Fear of current or ex partner: Not on file    Emotionally abused: Not on file    Physically abused: Not on file    Forced sexual activity: Not on file  Other Topics Concern  . Not on file  Social History Narrative  . Not on file   Family History  Problem Relation Age  of Onset  . Diabetes Mother   . Diabetes Father   . Hypertension Brother   . Heart disease Paternal Grandfather   . Heart disease Unknown   . Colon cancer Neg Hx     OBJECTIVE:  Vitals:   06/28/17 1132  BP: (!) 135/91  Pulse: 86  Resp: 18  Temp: 98.2 F (36.8 C)  TempSrc: Oral  SpO2: 99%     General appearance: alert; appears fatigued HEENT: Ears: EACs clear, TMs pearly gray with visible cone of light, without erythema; Eyes: PERRL, EOMI grossly; No nystagmus with dix-hallpike maneuver; Sinuses nontender to palpation; Nose: clear rhinorrhea; Throat: oropharynx clear, tonsils nonerythematous without white tonsillar exudates, uvula midline Neck: supple without LAD Lungs: CTA bilaterally without adventitious breath soudns Heart: regular rate and rhythm.  Radial pulses 2+ symmetrical bilaterally Skin: warm and dry Psychological: alert and cooperative; normal mood and affect  Imaging: No results found.  ASSESSMENT & PLAN:  1. Benign paroxysmal positional vertigo of right ear     Meds ordered this encounter  Medications  . meclizine (ANTIVERT) 25 MG tablet    Sig: Take 1 tablet (25 mg total) by mouth 3 (three) times daily as needed for dizziness.    Dispense:  30 tablet    Refill:  0    Order Specific Question:   Supervising Provider    Answer:   Wynona Luna [[id number]]   Rest and push fluids Recommend OTC zyrtec as needed for symptomatic relief Meclizine prescribed.  Take as directed and as needed for symptomatic relief Referral to ENT Return or go to ER if you have any new  or worsening symptoms  Reviewed expectations re: course of current medical issues. Questions answered. Outlined signs and symptoms indicating need for more acute intervention. Patient verbalized understanding. After Visit Summary given.         Lestine Box, PA-C 06/28/17 1246

## 2017-07-13 DIAGNOSIS — G4733 Obstructive sleep apnea (adult) (pediatric): Secondary | ICD-10-CM | POA: Diagnosis not present

## 2017-08-12 DIAGNOSIS — G4733 Obstructive sleep apnea (adult) (pediatric): Secondary | ICD-10-CM | POA: Diagnosis not present

## 2017-09-14 DIAGNOSIS — G4733 Obstructive sleep apnea (adult) (pediatric): Secondary | ICD-10-CM | POA: Diagnosis not present

## 2017-09-28 DIAGNOSIS — G4733 Obstructive sleep apnea (adult) (pediatric): Secondary | ICD-10-CM | POA: Diagnosis not present

## 2017-10-14 DIAGNOSIS — G4733 Obstructive sleep apnea (adult) (pediatric): Secondary | ICD-10-CM | POA: Diagnosis not present

## 2017-11-13 DIAGNOSIS — G4733 Obstructive sleep apnea (adult) (pediatric): Secondary | ICD-10-CM | POA: Diagnosis not present

## 2017-12-06 DIAGNOSIS — E785 Hyperlipidemia, unspecified: Secondary | ICD-10-CM | POA: Diagnosis not present

## 2017-12-06 DIAGNOSIS — E119 Type 2 diabetes mellitus without complications: Secondary | ICD-10-CM | POA: Diagnosis not present

## 2017-12-06 DIAGNOSIS — G4700 Insomnia, unspecified: Secondary | ICD-10-CM | POA: Diagnosis not present

## 2017-12-13 DIAGNOSIS — G4733 Obstructive sleep apnea (adult) (pediatric): Secondary | ICD-10-CM | POA: Diagnosis not present

## 2017-12-21 DIAGNOSIS — H16223 Keratoconjunctivitis sicca, not specified as Sjogren's, bilateral: Secondary | ICD-10-CM | POA: Diagnosis not present

## 2017-12-21 DIAGNOSIS — H2513 Age-related nuclear cataract, bilateral: Secondary | ICD-10-CM | POA: Diagnosis not present

## 2017-12-21 DIAGNOSIS — E119 Type 2 diabetes mellitus without complications: Secondary | ICD-10-CM | POA: Diagnosis not present

## 2018-01-12 DIAGNOSIS — G4733 Obstructive sleep apnea (adult) (pediatric): Secondary | ICD-10-CM | POA: Diagnosis not present

## 2018-02-11 DIAGNOSIS — G4733 Obstructive sleep apnea (adult) (pediatric): Secondary | ICD-10-CM | POA: Diagnosis not present

## 2018-02-16 DIAGNOSIS — K317 Polyp of stomach and duodenum: Secondary | ICD-10-CM

## 2018-02-16 HISTORY — DX: Polyp of stomach and duodenum: K31.7

## 2018-03-15 DIAGNOSIS — G4733 Obstructive sleep apnea (adult) (pediatric): Secondary | ICD-10-CM | POA: Diagnosis not present

## 2018-03-25 ENCOUNTER — Encounter: Payer: Self-pay | Admitting: *Deleted

## 2018-03-28 ENCOUNTER — Telehealth: Payer: Self-pay | Admitting: Internal Medicine

## 2018-03-28 MED ORDER — RABEPRAZOLE SODIUM 20 MG PO TBEC: DELAYED_RELEASE_TABLET | ORAL | 0 refills | Status: DC

## 2018-03-28 NOTE — Telephone Encounter (Signed)
Rx sent 

## 2018-03-28 NOTE — Telephone Encounter (Signed)
Pt is sched to see dr.pyrtle on 05/03/2018 @8 :45am

## 2018-03-28 NOTE — Telephone Encounter (Signed)
Left voicemail for patient that he needs office visit for refills as we havent seen him in 2 years.

## 2018-03-30 ENCOUNTER — Telehealth: Payer: Self-pay | Admitting: Internal Medicine

## 2018-03-30 NOTE — Telephone Encounter (Signed)
Pt states that insurance requires PA for Aciphex BID.  Pt says that his acid reflux has been bothering him and he doesn't have meds.  Can you give him something in the meantime?

## 2018-03-31 NOTE — Telephone Encounter (Signed)
I have completed a prior authorization request through [email or web address] for aciphex twice daily dosing. We will await response. In addition, I have left a voicemail for patient advising that he get OTC Nexium or omeprazole to take while we are awaiting aciphex approval.  Dx: Barretts Tried and failed: omeprazole 20, omeprazole 20 twice daily dosing.

## 2018-04-04 NOTE — Telephone Encounter (Signed)
Patient's rabeprazole 20 mg twice daily has been approved through [employer] through 04/01/2019. [id number].

## 2018-04-14 DIAGNOSIS — G4733 Obstructive sleep apnea (adult) (pediatric): Secondary | ICD-10-CM | POA: Diagnosis not present

## 2018-05-01 ENCOUNTER — Encounter: Payer: Self-pay | Admitting: Internal Medicine

## 2018-05-03 ENCOUNTER — Other Ambulatory Visit: Payer: Self-pay

## 2018-05-03 ENCOUNTER — Encounter: Payer: Self-pay | Admitting: Internal Medicine

## 2018-05-03 ENCOUNTER — Ambulatory Visit: Payer: 59 | Admitting: Internal Medicine

## 2018-05-03 VITALS — BP 126/72 | HR 96 | Temp 97.9°F | Ht 77.75 in | Wt 290.0 lb

## 2018-05-03 DIAGNOSIS — J0100 Acute maxillary sinusitis, unspecified: Secondary | ICD-10-CM | POA: Diagnosis not present

## 2018-05-03 DIAGNOSIS — K317 Polyp of stomach and duodenum: Secondary | ICD-10-CM | POA: Diagnosis not present

## 2018-05-03 DIAGNOSIS — K219 Gastro-esophageal reflux disease without esophagitis: Secondary | ICD-10-CM | POA: Diagnosis not present

## 2018-05-03 DIAGNOSIS — K760 Fatty (change of) liver, not elsewhere classified: Secondary | ICD-10-CM

## 2018-05-03 MED ORDER — RABEPRAZOLE SODIUM 20 MG PO TBEC: DELAYED_RELEASE_TABLET | ORAL | 3 refills | Status: DC

## 2018-05-03 NOTE — Patient Instructions (Signed)
We have sent the following medications to your pharmacy for you to pick up at your convenience: Aciphex 20 mg twice daily  Continue Vitamin E 800 mg daily.  You will be due for a recall endoscopy in 12/2018. We will send you a reminder in the mail when it gets closer to that time.  We will request your most recent labs from Dr Orland Mustard.  If you are age 56 or older, your body mass index should be between 23-30. Your Body mass index is 33.73 kg/m. If this is out of the aforementioned range listed, please consider follow up with your Primary Care Provider.  If you are age 47 or younger, your body mass index should be between 19-25. Your Body mass index is 33.73 kg/m. If this is out of the aformentioned range listed, please consider follow up with your Primary Care Provider.

## 2018-05-03 NOTE — Progress Notes (Signed)
Subjective:    Patient ID: Timothy Dixon, male    DOB: [birth year], 58 y.o.   MRN: [id number]  HPI Timothy Dixon is a 58 year old male with a history of GERD, hiatal hernia, hyperplastic gastric polyps, nonalcoholic fatty liver disease, hyperlipidemia, obesity who is here for follow-up.  He was last seen in the office on 02/19/2016 and for upper endoscopy on 04/09/2016.  He is here alone today.  He reports he has been feeling well.  He remains on AcipHex 20 mg twice a day.  His heartburn is well controlled however he will have breakthrough heartburn on once a day PPI dosing and certainly daily if he does not take acid controlling medication.  In the last 6 weeks or so he is noticed some "tightness" with swallowing solid food.  He is not having to push food down with liquid nor vomit it back up.  No early satiety.  No nausea or vomiting.  Bowel movements have been regular without blood or melena.  His weight has increased since last visit from 266 to 290 today.  He has not been as active lately and has been aware of the weight gain.  He has had some fluctuating blood sugars being watched by primary care and he is now on metformin.  He is taking B12 daily as well as 800 units of vitamin E.  Last colonoscopy 04/09/2016.  3 mm rectal polyp removed and found to be hyperplastic.  Exam otherwise normal.  Last upper endoscopy same day.  Island of salmon-colored mucosa biopsied.  2 cm hiatal hernia.  Empiric dilation with Savary dilator to 17 mm.  Multiple 3 to 15 mm pedunculated and sessile polyps in the gastric cardia, fundus and body biopsied.  Normal antrum, pylorus and examined duodenum.  GE junction mild chronic inflammation.  No Barrett's.  Stomach biopsies hyperplastic polyps and benign gastric epithelium.  No H. pylori.   Review of Systems As per HPI, otherwise negative  Current Medications, Allergies, Past Medical History, Past Surgical History, Family History and Social History were reviewed  in [employer] record.     Objective:   Physical Exam BP 126/72   Pulse 96   Temp 97.9 F (36.6 C) (Oral)   Ht 6' 5.75" (1.975 m)   Wt 290 lb (131.5 kg)   BMI 33.73 kg/m  Gen: awake, alert, NAD HEENT: anicteric, op clear CV: RRR, no mrg Pulm: CTA b/l Abd: soft, NT/ND, +BS throughout Ext: no c/c/e Neuro: nonfocal  Labs requested from PCP    Assessment & Plan:   58 year old male with a history of GERD, hiatal hernia, hyperplastic gastric polyps, nonalcoholic fatty liver disease, hyperlipidemia, obesity who is here for follow-up.    1.  GERD with hiatal hernia --stable on AcipHex 20 mg twice daily.  Mild dysphagia and I have recommended we repeat endoscopy and likely dilation.  We will delay this until summer given the ongoing pandemic.  PPI likely contributing to gastric polyps, see #2  2.  Gastric polyps --fundic gland in appearance but also hyperplastic polyps as well.  We discussed that hyperplastic polyps while rare can develop dysplasia.  He did not have any evidence of this 2 years ago.  Surveillance endoscopy is reasonable for hyperplastic polyps to ensure they are not increasing in size.  We will be delaying this until the summer 2020 as discussed in #1.  3.  Nonalcoholic fatty liver disease --weight dependent enzyme elevation for him.  He had lost 25 pounds  at his last visit and had normalization of his liver enzymes.  My concern is that his liver enzymes may be elevated again with his weight gain.  We discussed the importance of diet and exercise.  He is planning to begin an exercise regimen and work on losing the weight that he has now gained back.  Labs of been performed recently by primary care and I have requested these for my review.  He will continue vitamin E 800 IU daily.  No evidence for advanced liver disease or cirrhosis.  25 minutes spent with the patient today. Greater than 50% was spent in counseling and coordination of care with the  patient

## 2018-05-14 DIAGNOSIS — G4733 Obstructive sleep apnea (adult) (pediatric): Secondary | ICD-10-CM | POA: Diagnosis not present

## 2018-05-24 DIAGNOSIS — Z0000 Encounter for general adult medical examination without abnormal findings: Secondary | ICD-10-CM | POA: Diagnosis not present

## 2018-05-24 DIAGNOSIS — E785 Hyperlipidemia, unspecified: Secondary | ICD-10-CM | POA: Diagnosis not present

## 2018-05-24 DIAGNOSIS — E119 Type 2 diabetes mellitus without complications: Secondary | ICD-10-CM | POA: Diagnosis not present

## 2018-05-24 DIAGNOSIS — Z125 Encounter for screening for malignant neoplasm of prostate: Secondary | ICD-10-CM | POA: Diagnosis not present

## 2018-06-13 DIAGNOSIS — G4733 Obstructive sleep apnea (adult) (pediatric): Secondary | ICD-10-CM | POA: Diagnosis not present

## 2018-07-13 DIAGNOSIS — G4733 Obstructive sleep apnea (adult) (pediatric): Secondary | ICD-10-CM | POA: Diagnosis not present

## 2018-08-18 ENCOUNTER — Ambulatory Visit
Admission: RE | Admit: 2018-08-18 | Discharge: 2018-08-18 | Disposition: A | Discharge status: Home / Self Care | Payer: 59 | Source: Ambulatory Visit | Attending: Family Medicine | Admitting: Family Medicine

## 2018-08-18 ENCOUNTER — Other Ambulatory Visit: Payer: Self-pay | Admitting: Family Medicine

## 2018-08-18 DIAGNOSIS — R0602 Shortness of breath: Secondary | ICD-10-CM

## 2018-10-19 ENCOUNTER — Encounter: Payer: Self-pay | Admitting: Internal Medicine

## 2018-10-19 ENCOUNTER — Ambulatory Visit: Payer: 59 | Admitting: Internal Medicine

## 2018-10-19 ENCOUNTER — Other Ambulatory Visit: Payer: Self-pay

## 2018-10-19 VITALS — BP 128/86 | HR 87 | Temp 97.3°F | Ht 77.0 in | Wt 289.8 lb

## 2018-10-19 DIAGNOSIS — R0602 Shortness of breath: Secondary | ICD-10-CM

## 2018-10-19 NOTE — Patient Instructions (Addendum)
It was nice to meet you, here is recap of plan (1) Reach out to [facility] regarding the issues with your CPAP (2) I will reach out to Dr. Hilarie Fredrickson regarding moving up your EGD. (3) Get lung function tests, I will call you with results.    Please do your part to reduce the spread of COVID-19.

## 2018-10-19 NOTE — Progress Notes (Signed)
-  Synopsis: Referred in September 2020 for shortness of breath by London Pepper, MD  Subjective:   PATIENT ID: Timothy Dixon GENDER: male DOB: Feb 26, 1960, MRN: [id number]  Chief Complaint  Patient presents with  . Consult    Consult re: SOB. He reports develpoing SOB 4 weeks ago. States his chest feels heavy on the lower right side. He reports he does not feel like he can take a deep breath. Uses cpap at night, managed by cardiology.   58 year old gentleman referred for evaluation of shortness of breath past medical history of anxiety Barrett's esophagus gastroesophageal reflux disease fatty liver disease, obesity, OSA, hyperlipidemia.  Follows with Dr. Hilarie Fredrickson in gastroenterology for gastroesophageal reflux, hyperplastic gastric polyps and nonalcoholic fatty liver disease.  For the past 4 weeks, patient has been noticing more SOB.  It comes on during random times of the day, not associated with exertion or prandial status.  It is associated with a vague feeling of discomfort in R chest that patient has difficulty describing.  This pain is non-pleuritic, also not associated with exercise or position or time of day.  He states it feels like its coming from his "bronchioles" similar to a time when he had bronchitis in past.  This SOB and chest pain was not preceded by any illness, aspiration, or injury that the patient can remember.  There are times when the patient just feels like he cannot take in a deep breath.  He is less clear as to whether the chest discomfort and inability to breath in deeply are related.  Overall, these constellation of symptoms have been improving since onset 4 weeks ago but have plateau'd to a "mild and annoying" status. He does state he is overdue to have his esophagus "stretched" but the discomfort from that is a bit different involving the throat and upper sternum associated with swallowing difficulty.  Workup to date includes a normal chest x-ray.    Around the same  time as these random bouts of dyspnea, patient has noticed his nasal pillow CPAP does not seem to be providing as much pressure and is "dripping more" into his nose and throat causing congestion and productive cough in morning.  Patient has a clean cardiac cath from 2010.  Patient is a nondrinker, nonsmoker. No recent changes in weight or B symptoms.  He comes self-referred    Past Medical History:  Diagnosis Date  . Anxiety   . Barrett esophagus   . Elevated LFTs   . Esophageal reflux   . Fatty liver   . Gallbladder polyp   . Gastric polyp   . GERD (gastroesophageal reflux disease)   . Hepatic steatosis   . Hiatal hernia   . Hypercholesterolemia   . OSA (obstructive sleep apnea)   . Sleep apnea      Family History  Problem Relation Age of Onset  . Diabetes Mother   . Diabetes Father   . Hypertension Brother   . Heart disease Paternal Grandfather   . Heart disease Other   . Colon cancer Neg Hx      Past Surgical History:  Procedure Laterality Date  . CARDIAC CATHETERIZATION  2010   Normal coronary arteries by cath  . COLONOSCOPY    . ESOPHAGEAL MANOMETRY N/A 09/26/2012   Procedure: ESOPHAGEAL MANOMETRY (EM);  Surgeon: Sable Feil, MD;  Location: [facility];  Service: Endoscopy;  Laterality: N/A;  . UPPER GASTROINTESTINAL ENDOSCOPY      Social History   Socioeconomic History  .  Marital status: Married    Spouse name: Not on file  . Number of children: Not on file  . Years of education: Not on file  . Highest education level: Not on file  Occupational History  . Occupation: Office manager: UNEMPLOYED  Social Needs  . Financial resource strain: Not on file  . Food insecurity    Worry: Not on file    Inability: Not on file  . Transportation needs    Medical: Not on file    Non-medical: Not on file  Tobacco Use  . Smoking status: Never Smoker  . Smokeless tobacco: Never Used  Substance and Sexual Activity  . Alcohol use: Yes     Comment: rare beer  . Drug use: No  . Sexual activity: Not on file  Lifestyle  . Physical activity    Days per week: Not on file    Minutes per session: Not on file  . Stress: Not on file  Relationships  . Social Herbalist on phone: Not on file    Gets together: Not on file    Attends religious service: Not on file    Active member of club or organization: Not on file    Attends meetings of clubs or organizations: Not on file    Relationship status: Not on file  . Intimate partner violence    Fear of current or ex partner: Not on file    Emotionally abused: Not on file    Physically abused: Not on file    Forced sexual activity: Not on file  Other Topics Concern  . Not on file  Social History Narrative  . Not on file     No Known Allergies   Outpatient Medications Prior to Visit  Medication Sig Dispense Refill  . atorvastatin (LIPITOR) 20 MG tablet Take 20 mg by mouth daily at 6 PM.     . metFORMIN (GLUCOPHAGE) 500 MG tablet Take 1 tablet by mouth 2 (two) times daily with a meal.    . RABEprazole (ACIPHEX) 20 MG tablet TAKE 1 TABLET BY MOUTH  TWICE A DAY 180 tablet 3  . scopolamine (TRANSDERM-SCOP) 1 MG/3DAYS APPLY 1 PATCH TOPICALLY TO SKIN AS NEEDED AS DIRECTED    . vitamin B-12 (CYANOCOBALAMIN) 1000 MCG tablet Take 1,000 mcg by mouth daily.    . vitamin E 400 UNIT capsule Take 800 Units by mouth daily. Two capsules daily     Facility-Administered Medications Prior to Visit  Medication Dose Route Frequency Provider Last Rate Last Dose  . 0.9 %  sodium chloride infusion  500 mL Intravenous Continuous Pyrtle, Lajuan Lines, MD         Positive Symptoms in bold:  Constitutional fevers, chills, weight loss, fatigue, anorexia, malaise  Eyes decreased vision, double vision, eye irritation  Ears, Nose, Mouth, Throat sore throat, trouble swallowing, sinus congestion  Cardiovascular chest pain, paroxysmal nocturnal dyspnea, lower ext edema, palpitations   Respiratory  SOB, cough, DOE, hemoptysis, wheezing  Gastrointestinal nausea, vomiting, diarrhea  Genitourinary burning with urination, trouble urinating  Musculoskeletal joint aches, joint swelling, back pain  Integumentary  rashes, skin lesions  Neurological focal weakness, focal numbness, trouble speaking, headaches  Psychiatric depression, anxiety, confusion  Endocrine polyuria, polydipsia, cold intolerance, heat intolerance  Hematologic abnormal bruising, abnormal bleeding, unexplained nose bleeds  Allergic/Immunologic recurrent infections, hives, swollen lymph nodes     Objective:   GEN: overweight man in NAD HEENT: MMM, no thrush  CV: RRR, ext warm PULM: Clear bilaterally with no accessory muscle use, no chest wall tenderness to palpation, no palpable masses in area superficial to discomfort GI: Soft, +BS EXT: No edema NEURO: Moves all 4 ext to command, ambulates normally PSYCH: AOx3, poor insight, anxious SKIN: No rashes   Vitals:   10/19/18 1616  BP: 128/86  Pulse: 87  Temp: (!) 97.3 F (36.3 C)  TempSrc: Temporal  SpO2: 97%  Weight: 289 lb 12.8 oz (131.5 kg)  Height: 6\' 5"  (1.956 m)   97% on RA BMI Readings from Last 3 Encounters:  10/19/18 34.37 kg/m  05/03/18 33.73 kg/m  04/09/16 30.94 kg/m   Wt Readings from Last 3 Encounters:  10/19/18 289 lb 12.8 oz (131.5 kg)  05/03/18 290 lb (131.5 kg)  04/09/16 266 lb (120.7 kg)     CBC    Component Value Date/Time   WBC 5.5 06/25/2011 1005   RBC 5.31 06/25/2011 1005   HGB 14.3 06/25/2011 1005   HCT 42.4 06/25/2011 1005   PLT 161.0 06/25/2011 1005   MCV 79.9 06/25/2011 1005   MCHC 33.7 06/25/2011 1005   RDW 13.9 06/25/2011 1005   LYMPHSABS 2.0 06/25/2011 1005   MONOABS 0.7 06/25/2011 1005   EOSABS 0.1 06/25/2011 1005   BASOSABS 0.0 06/25/2011 1005    PSG 2009 AHI in 50's.  Looks like CPAP 7 cmH2O recommended, hard to read on scan,  Chest Imaging: Chest x-ray 08/18/2018: No active cardiopulmonary disease. The  patient's images have been independently reviewed by me.    Pulmonary Functions Testing Results: No flowsheet data found.  FeNO: None   Pathology: None   Echocardiogram: None   Heart Catheterization: 2010 WNL  Assessment & Plan:  (1) Periodic dyspnea- sporadic, no association to exercise, prandial status, time of day, position.  No preceding illness, trauma, aspiration episode.   Normal imaging, exam.  Improving without specific therapy.  Patient seems very anxious regarding but wants to try stepwise approach to management.   (2) Vague R chest fullness/discomfort associated to above, history of barrett's, esophageal dysmotility. (3) Possible malfunctioning CPAP machine starting around time of above complaints (4) OSA on CPAP (5) Generalized anxiety disorder  Discussion: Odd case.  One linking symptom would be worse sleep quality related to malfunctioning CPAP machine vs. Interface resulting in exacerbation of underlying anxiety disorder somatisizing as above.  Another possibility is these are just atypical referred esophageal spasm/discomfort.  I offered trial of steroids, checking a CT chest but we ultimately decided that due to the improving and now mild symptoms, should just gather more data and work on the pathology we know the patient has (esophageal issues, OSA). - Patient will reach out to [facility] about his CPAP machine, it has not been replaced in a number of years.  I informed him we would be happy to prescribe any equipment changes he made need. - Sent a message to Dr. Hilarie Fredrickson (GI) regarding patient desire to move up his EGD from Nov to closer in future - PFTs tentatively scheduled, patient will cancel these if his symptoms resolve; I will call him with results - If patient's symptoms improve with GI and/or DME intervention, he can f/u PRN.  If they persist despite any adjustments, he will reach out and come back and we can consider more workup.   Current Outpatient Medications:   .  atorvastatin (LIPITOR) 20 MG tablet, Take 20 mg by mouth daily at 6 PM. , Disp: , Rfl:  .  metFORMIN (GLUCOPHAGE)  500 MG tablet, Take 1 tablet by mouth 2 (two) times daily with a meal., Disp: , Rfl:  .  RABEprazole (ACIPHEX) 20 MG tablet, TAKE 1 TABLET BY MOUTH  TWICE A DAY, Disp: 180 tablet, Rfl: 3 .  scopolamine (TRANSDERM-SCOP) 1 MG/3DAYS, APPLY 1 PATCH TOPICALLY TO SKIN AS NEEDED AS DIRECTED, Disp: , Rfl:  .  vitamin B-12 (CYANOCOBALAMIN) 1000 MCG tablet, Take 1,000 mcg by mouth daily., Disp: , Rfl:  .  vitamin E 400 UNIT capsule, Take 800 Units by mouth daily. Two capsules daily, Disp: , Rfl:   Current Facility-Administered Medications:  .  0.9 %  sodium chloride infusion, 500 mL, Intravenous, Continuous, Pyrtle, Lajuan Lines, MD   Erskine Emery MD [facility] 10/19/2018 7:28 PM

## 2018-11-02 ENCOUNTER — Telehealth: Payer: Self-pay | Admitting: *Deleted

## 2018-11-02 NOTE — Telephone Encounter (Signed)
-----   Message from Jerene Bears, MD sent at 11/01/2018  8:44 AM EDT ----- Regarding: RE: [facility] patient Linna Hoff Thanks for your message.  Sorry for the delayed response.  I have been out of the office for 10 days. I will reach out to him JayDottie, Office followup for Mr. Franssen, nonurgent Thanks JMP ----- Message ----- From: Candee Furbish, MD Sent: 10/19/2018   4:35 PM EDT To: Jerene Bears, MD Subject: [facility] patient                                 Hi I saw this patient for a strange feeling of R sided discomfort/ "presence" that has been going on for 3 weeks.  CXR is fine.  Nonexertional, not clearly related to prandial state, a little worse when he lays down, random onset of worsening.  He does say he gets EGDs every so often from you and is scheduled in Nov.  I'm at a loss for a lung or cardiac cause for this, getting PFTs for completeness sake.   He does not want a trial of steroids nor a CT of chest.  I said I would reach out and see what you thought about moving up his EGD a bit to see if his esophagus may be the culprit.  Thanks, Erskine Emery MD Pulmonology

## 2018-11-02 NOTE — Telephone Encounter (Signed)
Left message for patient to call back. Patient has been scheduled for appointment 12/14/18.

## 2018-11-03 NOTE — Telephone Encounter (Signed)
Pt calling back pt aware of appt on 12-14-18 @ 4:00pm

## 2018-11-25 ENCOUNTER — Telehealth: Payer: Self-pay | Admitting: Internal Medicine

## 2018-11-28 NOTE — Telephone Encounter (Signed)
Attempted to call patient, no answer, left message to call back.  

## 2018-11-28 NOTE — Telephone Encounter (Signed)
L/m on pt vm to call back to schedule pft -pr  °

## 2018-11-29 NOTE — Telephone Encounter (Signed)
3rd attempt to schedule pft - closed encounter -pr

## 2018-12-14 ENCOUNTER — Other Ambulatory Visit (INDEPENDENT_AMBULATORY_CARE_PROVIDER_SITE_OTHER): Payer: 59

## 2018-12-14 ENCOUNTER — Encounter: Payer: Self-pay | Admitting: Internal Medicine

## 2018-12-14 ENCOUNTER — Ambulatory Visit: Payer: 59 | Admitting: Internal Medicine

## 2018-12-14 ENCOUNTER — Other Ambulatory Visit: Payer: Self-pay

## 2018-12-14 VITALS — BP 120/90 | HR 84 | Temp 97.9°F | Ht 77.75 in | Wt 283.1 lb

## 2018-12-14 DIAGNOSIS — R1310 Dysphagia, unspecified: Secondary | ICD-10-CM | POA: Diagnosis not present

## 2018-12-14 DIAGNOSIS — K219 Gastro-esophageal reflux disease without esophagitis: Secondary | ICD-10-CM | POA: Diagnosis not present

## 2018-12-14 DIAGNOSIS — K760 Fatty (change of) liver, not elsewhere classified: Secondary | ICD-10-CM | POA: Diagnosis not present

## 2018-12-14 DIAGNOSIS — K317 Polyp of stomach and duodenum: Secondary | ICD-10-CM

## 2018-12-14 DIAGNOSIS — K824 Cholesterolosis of gallbladder: Secondary | ICD-10-CM

## 2018-12-14 LAB — PROTIME-INR
INR: 1.1 ratio — ABNORMAL HIGH (ref 0.8–1.0)
Prothrombin Time: 13 s (ref 9.6–13.1)

## 2018-12-14 LAB — COMPREHENSIVE METABOLIC PANEL
ALT: 27 U/L (ref 0–53)
AST: 19 U/L (ref 0–37)
Albumin: 4.8 g/dL (ref 3.5–5.2)
Alkaline Phosphatase: 96 U/L (ref 39–117)
BUN: 13 mg/dL (ref 6–23)
CO2: 28 mEq/L (ref 19–32)
Calcium: 9.6 mg/dL (ref 8.4–10.5)
Chloride: 104 mEq/L (ref 96–112)
Creatinine, Ser: 1.08 mg/dL (ref 0.40–1.50)
GFR: 70.77 mL/min (ref >60.00)
Glucose, Bld: 109 mg/dL — ABNORMAL HIGH (ref 70–99)
Potassium: 4 mEq/L (ref 3.5–5.1)
Sodium: 139 mEq/L (ref 135–145)
Total Bilirubin: 0.8 mg/dL (ref 0.2–1.2)
Total Protein: 7.4 g/dL (ref 6.0–8.3)

## 2018-12-14 LAB — CBC
HCT: 44.1 % (ref 39.0–52.0)
Hemoglobin: 14.8 g/dL (ref 13.0–17.0)
MCHC: 33.5 g/dL (ref 30.0–36.0)
MCV: 79.1 fl (ref 78.0–100.0)
Platelets: 164 10*3/uL (ref 150.0–400.0)
RBC: 5.57 Mil/uL (ref 4.22–5.81)
RDW: 14.5 % (ref 11.5–15.5)
WBC: 5.9 10*3/uL (ref 4.0–10.5)

## 2018-12-14 MED ORDER — RABEPRAZOLE SODIUM 20 MG PO TBEC: DELAYED_RELEASE_TABLET | ORAL | 1 refills | Status: DC

## 2018-12-14 NOTE — Progress Notes (Signed)
   Subjective:    Patient ID: Timothy Dixon, male    DOB: 1960/07/20, 58 y.o.   MRN: [id number]  HPI Timothy Dixon is a 58 year old male with a history of GERD, hiatal hernia, esophageal dysphagia responsive to prior dilation, hyperplastic gastric polyps, nonalcoholic fatty liver disease, hyperlipidemia, obesity who is here for follow-up.  He was last seen on 05/03/2018.  He is here alone today.  Unfortunately he thought he was presenting for upper endoscopy today but he was scheduled for an office visit.  He reports he is having issues with swallowing.  He feels like food is sticking solids more than liquids in his esophagus.  This is intermittent.  He occasionally feels a numb globus sensation near the sternal notch.  He is having no abdominal pain.  Regular bowel movements without blood in his stool or melena.  He was seen by pulmonology over an area of possible irritation in his right upper lung but this checked out okay.  Chest x-ray was unremarkable.  He has continued vitamin E 800 IU daily  Review of Systems As per HPI, otherwise negative  Current Medications, Allergies, Past Medical History, Past Surgical History, Family History and Social History were reviewed in [employer] record.      Objective:   Physical Exam BP 120/90 (BP Location: Left Arm, Patient Position: Sitting, Cuff Size: Normal)   Pulse 84   Temp 97.9 F (36.6 C)   Ht 6' 5.75" (1.975 m)   Wt 283 lb 2 oz (128.4 kg)   BMI 32.93 kg/m  Gen: awake, alert, NAD HEENT: anicteric CV: RRR, no mrg Pulm: CTA b/l Abd: soft, NT/ND, +BS throughout Ext: no c/c/e Neuro: nonfocal      Assessment & Plan:  58 year old male with a history of GERD, hiatal hernia, esophageal dysphagia responsive to prior dilation, hyperplastic gastric polyps, nonalcoholic fatty liver disease, hyperlipidemia, obesity who is here for follow-up.   1.  GERD with history of hiatal hernia/esophageal dysphagia --GERD  symptoms are mostly controlled though he has had some recurrent solid food dysphagia.  This was responsive to dilation previously.  We will proceed with upper endoscopy tomorrow for evaluation and probable dilation.  We discussed the risk, benefits and alternatives and he is agreeable and wishes to proceed --Continue AcipHex 20 mg twice daily --EGD tomorrow  2.  History of gastric polyps --history of fundic gland appearing polyps but also hyperplastic polyps.  We will survey these tomorrow at endoscopy.  3.  Nonalcoholic fatty liver disease --he has had weight dependent elevation in serum transaminases.  This warrants repeat lab work today and I recommended ultrasound with elastography to reexamine liver parenchyma and also try to assess any degree of fibrosis.  Vitamin E continues to be recommended --CBC, CMP INR --Ultrasound with elastography --Continue vitamin E 800 IU daily --Continue to work on risk factor modification including diet exercise and control of risk factors including blood sugars.  4.  History of gallbladder polyp --stable previously but last seen in 2015.  The ultrasound discussed in #3 we will also reevaluate and ensure stability of this gallbladder polyp

## 2018-12-14 NOTE — Patient Instructions (Addendum)
Your provider has requested that you go to the basement level for lab work before leaving today. Press "B" on the elevator. The lab is located at the first door on the left as you exit the elevator.  Continue Aciphex twice daily.  Continue Vitamin E 800 iu daily.  You have been scheduled for an endoscopy. Please follow written instructions given to you at your visit today. If you use inhalers (even only as needed), please bring them with you on the day of your procedure.  HOLD METFORMIN morning of your procedure. ________________________________________________________ Dennis Bast have been scheduled for an abdominal ultrasound with elastography at [facility] Radiology (1st floor). Your appointment is scheduled for Wednesday 12/21/2018 at 8:00 am. Please arrive 15 minutes prior to your scheduled appointment for registration purposes. Make certain not to have anything to eat or drink 6 hours prior to your procedure. Should you need to reschedule your appointment, you may contact radiology at [phone].  Liver Elastography Various chronic liver diseases such as hepatitis B, C, and fatty liver disease can lead to tissue damage and subsequent scar tissue formation. As the scar tissue accumulates, the liver loses some of its elasticity and becomes stiffer. Liver elastography involves the use of a surface ultrasound probe that delivers a low frequency pulse or shear wave to a small volume of liver tissue under the rib cage. The transmission of the sound wave is completely painless. How Is a Liver Elastography Performed? The liver is located in the right upper abdomen under the rib cage. Patients are asked to lie flat on an examination table. A technician places the FibroScan probe between the ribs on the right side of the lower chest wall. A series of 10 painless pulses are then applied to the liver. The results are recorded on the equipment and an overall liver stiffness score is generated. This score is then  interpreted by a qualified physician to predict the likelihood of advanced fibrosis or cirrhosis.  Patients are asked to wear loose clothing and should not consume any liquids or solids for a minimum of 4 hours before the test to increase the likelihood of obtaining reliable test results. The scan will take 10 to 15 minutes to complete, but patients should plan on being available for 30 minutes to allow time for preparation _______________________________________________  If you are age 57 or older, your body mass index should be between 23-30. Your Body mass index is 32.93 kg/m. If this is out of the aforementioned range listed, please consider follow up with your Primary Care Provider.  If you are age 42 or younger, your body mass index should be between 19-25. Your Body mass index is 32.93 kg/m. If this is out of the aformentioned range listed, please consider follow up with your Primary Care Provider.

## 2018-12-15 ENCOUNTER — Other Ambulatory Visit: Payer: Self-pay | Admitting: Internal Medicine

## 2018-12-15 ENCOUNTER — Encounter: Payer: Self-pay | Admitting: Internal Medicine

## 2018-12-15 ENCOUNTER — Ambulatory Visit (AMBULATORY_SURGERY_CENTER): Payer: 59 | Admitting: Internal Medicine

## 2018-12-15 VITALS — BP 135/78 | HR 76 | Temp 97.6°F | Resp 12 | Ht 77.0 in | Wt 283.0 lb

## 2018-12-15 DIAGNOSIS — R1310 Dysphagia, unspecified: Secondary | ICD-10-CM | POA: Diagnosis not present

## 2018-12-15 DIAGNOSIS — K449 Diaphragmatic hernia without obstruction or gangrene: Secondary | ICD-10-CM

## 2018-12-15 DIAGNOSIS — K317 Polyp of stomach and duodenum: Secondary | ICD-10-CM

## 2018-12-15 DIAGNOSIS — K219 Gastro-esophageal reflux disease without esophagitis: Secondary | ICD-10-CM

## 2018-12-15 MED ORDER — SODIUM CHLORIDE 0.9 % IV SOLN: 500.0000 mL | Freq: Once | INTRAVENOUS | Status: DC

## 2018-12-15 NOTE — Progress Notes (Signed)
Pt's states no medical or surgical changes since previsit or office visit.  VSS-CW Temp-JB covid neg

## 2018-12-15 NOTE — Progress Notes (Signed)
Report to PACU, RN, vss, BBS= Clear.  

## 2018-12-15 NOTE — Patient Instructions (Signed)
Please read handouts provided. Await pathology results. Continue present medications.       YOU HAD AN ENDOSCOPIC PROCEDURE TODAY AT THE  ENDOSCOPY CENTER:   Refer to the procedure report that was given to you for any specific questions about what was found during the examination.  If the procedure report does not answer your questions, please call your gastroenterologist to clarify.  If you requested that your care partner not be given the details of your procedure findings, then the procedure report has been included in a sealed envelope for you to review at your convenience later.  YOU SHOULD EXPECT: Some feelings of bloating in the abdomen. Passage of more gas than usual.  Walking can help get rid of the air that was put into your GI tract during the procedure and reduce the bloating. If you had a lower endoscopy (such as a colonoscopy or flexible sigmoidoscopy) you may notice spotting of blood in your stool or on the toilet paper. If you underwent a bowel prep for your procedure, you may not have a normal bowel movement for a few days.  Please Note:  You might notice some irritation and congestion in your nose or some drainage.  This is from the oxygen used during your procedure.  There is no need for concern and it should clear up in a day or so.  SYMPTOMS TO REPORT IMMEDIATELY:     Following upper endoscopy (EGD)  Vomiting of blood or coffee ground material  New chest pain or pain under the shoulder blades  Painful or persistently difficult swallowing  New shortness of breath  Fever of 100F or higher  Black, tarry-looking stools  For urgent or emergent issues, a gastroenterologist can be reached at any hour by calling [phone].   DIET:  We do recommend a small meal at first, but then you may proceed to your regular diet.  Drink plenty of fluids but you should avoid alcoholic beverages for 24 hours.  ACTIVITY:  You should plan to take it easy for the rest of today  and you should NOT DRIVE or use heavy machinery until tomorrow (because of the sedation medicines used during the test).    FOLLOW UP: Our staff will call the number listed on your records 48-72 hours following your procedure to check on you and address any questions or concerns that you may have regarding the information given to you following your procedure. If we do not reach you, we will leave a message.  We will attempt to reach you two times.  During this call, we will ask if you have developed any symptoms of COVID 19. If you develop any symptoms (ie: fever, flu-like symptoms, shortness of breath, cough etc.) before then, please call [phone].  If you test positive for Covid 19 in the 2 weeks post procedure, please call and report this information to us.    If any biopsies were taken you will be contacted by phone or by letter within the next 1-3 weeks.  Please call us at [phone] if you have not heard about the biopsies in 3 weeks.    SIGNATURES/CONFIDENTIALITY: You and/or your care partner have signed paperwork which will be entered into your electronic medical record.  These signatures attest to the fact that that the information above on your After Visit Summary has been reviewed and is understood.  Full responsibility of the confidentiality of this discharge information lies with you and/or your care-partner. 

## 2018-12-15 NOTE — Op Note (Signed)
[facility] Patient Name: Timothy Dixon Reveal Procedure Date: 12/15/2018 3:31 PM MRN: [id number] Endoscopist: Jerene Bears , MD Age: 58 Referring MD:  Date of Birth: 03-Jun-1960 Gender: Male Account #: [id number] Procedure:                Upper GI endoscopy Indications:              Dysphagia, Gastro-esophageal reflux disease,                            Follow-up of gastric polyps Medicines:                Monitored Anesthesia Care Procedure:                Pre-Anesthesia Assessment:                           - Prior to the procedure, a History and Physical                            was performed, and patient medications and                            allergies were reviewed. The patient's tolerance of                            previous anesthesia was also reviewed. The risks                            and benefits of the procedure and the sedation                            options and risks were discussed with the patient.                            All questions were answered, and informed consent                            was obtained. Prior Anticoagulants: The patient has                            taken no previous anticoagulant or antiplatelet                            agents. ASA Grade Assessment: II - A patient with                            mild systemic disease. After reviewing the risks                            and benefits, the patient was deemed in                            satisfactory condition to undergo the procedure.  After obtaining informed consent, the endoscope was                            passed under direct vision. Throughout the                            procedure, the patient's blood pressure, pulse, and                            oxygen saturations were monitored continuously. The                            Endoscope was introduced through the mouth, and                            advanced to the second part of  duodenum. The upper                            GI endoscopy was accomplished without difficulty.                            The patient tolerated the procedure well. Scope In: Scope Out: Findings:                 The examined esophagus was normal.                           No endoscopic abnormality was evident in the                            esophagus to explain the patient's complaint of                            dysphagia. It was decided, however, to proceed with                            dilation of the entire esophagus. A guidewire was                            placed and the scope was withdrawn. Dilation was                            performed with a Savary dilator with mild                            resistance at 18 mm.                           A single 6 mm sessile polyp with no bleeding was                            found in the cardia. This polyp appears  hyperplastic with mild inflammation. Biopsies were                            taken with a cold forceps for histology.                           Multiple small pedunculated and sessile polyps                            (small, medium, and large measuring 3-15 mm) with                            no bleeding were found in the gastric fundus and in                            the gastric body. Multiple biopsies were obtained                            with cold forceps for histology in a targeted                            manner (from multiple different gastric polyps for                            sampling).                           The examined duodenum was normal.                           A 1 cm hiatal hernia was present. Complications:            No immediate complications. Estimated Blood Loss:     Estimated blood loss was minimal. Impression:               - Normal esophagus. No endoscopic esophageal                            abnormality to explain patient's dysphagia.                             Esophagus dilated with Savary 18 mm.                           - Small, 1 cm, hiatus hernia.                           - A single gastric polyp in the cardia. Biopsied.                           - Multiple gastric polyps. Sample biopsies.                           - Normal examined duodenum.                           -  Multiple biopsies were obtained. Recommendation:           - Patient has a contact number available for                            emergencies. The signs and symptoms of potential                            delayed complications were discussed with the                            patient. Return to normal activities tomorrow.                            Written discharge instructions were provided to the                            patient.                           - Resume previous diet.                           - Continue present medications.                           - Await pathology results. Jerene Bears, MD 12/15/2018 3:51:54 PM This report has been signed electronically.

## 2018-12-15 NOTE — Progress Notes (Signed)
Called to room to assist during endoscopic procedure.  Patient ID and intended procedure confirmed with present staff. Received instructions for my participation in the procedure from the performing physician.  

## 2018-12-19 ENCOUNTER — Telehealth: Payer: Self-pay | Admitting: *Deleted

## 2018-12-19 NOTE — Telephone Encounter (Signed)
  Follow up Call-  Call back number 12/15/2018 04/09/2016  Post procedure Call Back phone  # [phone]  Permission to leave phone message Yes Yes  Some recent data might be hidden    LMOM to call back with any questions or concerns.  Also, call back if patient has developed fever, respiratory issues or been dx with COVID or had any family members or close contacts diagnosed since her procedure.

## 2018-12-19 NOTE — Telephone Encounter (Signed)
First follow up call attempt.  Message left on voicemail. 

## 2018-12-21 ENCOUNTER — Encounter: Payer: Self-pay | Admitting: Internal Medicine

## 2018-12-21 ENCOUNTER — Other Ambulatory Visit: Payer: Self-pay

## 2018-12-21 ENCOUNTER — Ambulatory Visit (HOSPITAL_COMMUNITY)
Admission: RE | Admit: 2018-12-21 | Discharge: 2018-12-21 | Disposition: A | Discharge status: Home / Self Care | Payer: 59 | Source: Ambulatory Visit | Attending: Internal Medicine | Admitting: Internal Medicine

## 2018-12-21 ENCOUNTER — Telehealth: Payer: Self-pay

## 2018-12-21 DIAGNOSIS — K760 Fatty (change of) liver, not elsewhere classified: Secondary | ICD-10-CM

## 2018-12-21 DIAGNOSIS — K824 Cholesterolosis of gallbladder: Secondary | ICD-10-CM | POA: Insufficient documentation

## 2018-12-21 NOTE — Telephone Encounter (Signed)
Received call report from [place] [facility] regarding [place], result in epic. Calling about gallbladder wall thickening and possible polyp. Dr. Hilarie Fredrickson notified.

## 2018-12-21 NOTE — Telephone Encounter (Signed)
See result note.  

## 2018-12-22 ENCOUNTER — Telehealth: Payer: Self-pay | Admitting: Internal Medicine

## 2018-12-22 NOTE — Telephone Encounter (Signed)
See result note.  

## 2019-01-03 ENCOUNTER — Telehealth: Payer: Self-pay | Admitting: Internal Medicine

## 2019-01-03 NOTE — Telephone Encounter (Signed)
Message sent to referral coordinator with [facility] to check on pts appt. Records sent to pts PCP per request. Pt aware and knows we will get back with him regarding [facility] appt.

## 2019-01-03 NOTE — Telephone Encounter (Signed)
Pt called inquiring about referral to surgeon to have his gallbladder removed. He has not heard from the referral office yet. He is also asking to fax a copy of his  egd, labs and [place] to hi PCP.

## 2019-01-03 NOTE — Telephone Encounter (Signed)
Pt scheduled to see Dr. Donne Hazel 01/25/19@3 :40pm.

## 2019-01-04 NOTE — Telephone Encounter (Signed)
Pt aware.

## 2019-01-25 ENCOUNTER — Other Ambulatory Visit: Payer: Self-pay | Admitting: General Surgery

## 2019-01-25 DIAGNOSIS — K824 Cholesterolosis of gallbladder: Secondary | ICD-10-CM

## 2019-02-23 ENCOUNTER — Other Ambulatory Visit: Payer: Self-pay

## 2019-02-23 ENCOUNTER — Ambulatory Visit
Admission: RE | Admit: 2019-02-23 | Discharge: 2019-02-23 | Disposition: A | Discharge status: Home / Self Care | Payer: 59 | Source: Ambulatory Visit | Attending: General Surgery | Admitting: General Surgery

## 2019-02-23 DIAGNOSIS — K824 Cholesterolosis of gallbladder: Secondary | ICD-10-CM

## 2019-02-23 MED ORDER — GADOBENATE DIMEGLUMINE 529 MG/ML IV SOLN
20.0000 mL | Freq: Once | INTRAVENOUS | Status: AC | PRN
  Administered 2019-02-23: 20 mL via INTRAVENOUS

## 2019-05-08 ENCOUNTER — Other Ambulatory Visit: Payer: Self-pay

## 2019-05-08 ENCOUNTER — Ambulatory Visit (INDEPENDENT_AMBULATORY_CARE_PROVIDER_SITE_OTHER): Payer: 59 | Admitting: Podiatry

## 2019-05-08 ENCOUNTER — Encounter: Payer: Self-pay | Admitting: Podiatry

## 2019-05-08 ENCOUNTER — Ambulatory Visit (INDEPENDENT_AMBULATORY_CARE_PROVIDER_SITE_OTHER): Payer: 59

## 2019-05-08 VITALS — Temp 97.8°F

## 2019-05-08 DIAGNOSIS — M722 Plantar fascial fibromatosis: Secondary | ICD-10-CM | POA: Diagnosis not present

## 2019-05-08 DIAGNOSIS — M79672 Pain in left foot: Secondary | ICD-10-CM

## 2019-05-09 ENCOUNTER — Encounter: Payer: Self-pay | Admitting: Podiatry

## 2019-05-09 NOTE — Progress Notes (Signed)
Subjective:  Patient ID: Timothy Dixon, male    DOB: [birth year],  MRN: [id number]  Chief Complaint  Patient presents with  . Foot Pain    Patient presents today for left heel pain x 1 month.  "it feels like Im walking on a deep bruise and its really painful in the mornings and when I stand from sitting". He has only taking Motrin    59 y.o. male presents with the above complaint.  Patient presents with complaints of left plantar fasciitis.  Patient states it hurts when ambulating.  Patient has tried Motrin.  He has not seen anyone else for this.  Agree with the above chief complaint.   Review of Systems: Negative except as noted in the HPI. Denies N/V/F/Ch.  Past Medical History:  Diagnosis Date  . Anxiety   . Barrett esophagus   . Elevated LFTs   . Esophageal reflux   . Fatty liver   . Gallbladder polyp   . Gastric polyp   . GERD (gastroesophageal reflux disease)   . Hepatic steatosis   . Hiatal hernia   . Hypercholesterolemia   . OSA (obstructive sleep apnea)   . Sleep apnea     Current Outpatient Medications:  .  atorvastatin (LIPITOR) 20 MG tablet, Take 20 mg by mouth daily at 6 PM. , Disp: , Rfl:  .  LUMIFY 0.025 % SOLN, Apply 1 drop to eye every morning., Disp: , Rfl:  .  metFORMIN (GLUCOPHAGE) 1000 MG tablet, Take 1,000 mg by mouth 2 (two) times daily., Disp: , Rfl:  .  metFORMIN (GLUCOPHAGE) 500 MG tablet, Take 1 tablet by mouth 2 (two) times daily with a meal., Disp: , Rfl:  .  RABEprazole (ACIPHEX) 20 MG tablet, TAKE 1 TABLET BY MOUTH  TWICE A DAY, Disp: 180 tablet, Rfl: 1 .  vitamin B-12 (CYANOCOBALAMIN) 1000 MCG tablet, Take 1,000 mcg by mouth daily., Disp: , Rfl:  .  vitamin E 400 UNIT capsule, Take 800 Units by mouth daily. Two capsules daily, Disp: , Rfl:  .  XIIDRA 5 % SOLN, , Disp: , Rfl:   Social History   Tobacco Use  Smoking Status Never Smoker  Smokeless Tobacco Never Used    No Known Allergies Objective:   Vitals:   05/08/19 1402    Temp: 97.8 F (36.6 C)   There is no height or weight on file to calculate BMI. Constitutional Well developed. Well nourished.  Vascular Dorsalis pedis pulses palpable bilaterally. Posterior tibial pulses palpable bilaterally. Capillary refill normal to all digits.  No cyanosis or clubbing noted. Pedal hair growth normal.  Neurologic Normal speech. Oriented to person, place, and time. Epicritic sensation to light touch grossly present bilaterally.  Dermatologic Nails well groomed and normal in appearance. No open wounds. No skin lesions.  Orthopedic: Normal joint ROM without pain or crepitus bilaterally. No visible deformities. Tender to palpation at the calcaneal tuber left. No pain with calcaneal squeeze left. Ankle ROM full range of motion left. Silfverskiold Test: negative bilaterally.   Radiographs: Taken and reviewed. No acute fractures or dislocations. No evidence of stress fracture.  Plantar heel spur absent. Posterior heel spur present.   Assessment:   1. Plantar fasciitis of left foot    Plan:  Patient was evaluated and treated and all questions answered.  Plantar Fasciitis, left - XR reviewed as above.  - Educated on icing and stretching. Instructions given.  - Injection delivered to the plantar fascia as below. - DME: Plantar Fascial  Brace - Pharmacologic management: Meloxicam/Medrol Dose Pak. Educated on risks/benefits and proper taking of medication.  Procedure: Injection Tendon/Ligament Location: Left plantar fascia at the glabrous junction; medial approach. Skin Prep: alcohol Injectate: 0.5 cc 0.5% marcaine plain, 0.5 cc of 1% Lidocaine, 0.5 cc kenalog 10. Disposition: Patient tolerated procedure well. Injection site dressed with a band-aid.  No follow-ups on file.

## 2019-06-05 ENCOUNTER — Ambulatory Visit: Payer: 59 | Admitting: Podiatry

## 2019-06-06 ENCOUNTER — Ambulatory Visit (INDEPENDENT_AMBULATORY_CARE_PROVIDER_SITE_OTHER): Payer: 59 | Admitting: Podiatry

## 2019-06-06 ENCOUNTER — Other Ambulatory Visit: Payer: Self-pay

## 2019-06-06 DIAGNOSIS — Q666 Other congenital valgus deformities of feet: Secondary | ICD-10-CM | POA: Diagnosis not present

## 2019-06-06 DIAGNOSIS — M722 Plantar fascial fibromatosis: Secondary | ICD-10-CM | POA: Diagnosis not present

## 2019-06-06 DIAGNOSIS — M79672 Pain in left foot: Secondary | ICD-10-CM | POA: Diagnosis not present

## 2019-06-06 MED ORDER — MELOXICAM 7.5 MG PO TABS: 7.5000 mg | ORAL_TABLET | Freq: Every day | ORAL | 1 refills | Status: DC

## 2019-06-07 ENCOUNTER — Encounter: Payer: Self-pay | Admitting: Podiatry

## 2019-06-07 NOTE — Progress Notes (Signed)
Subjective:  Patient ID: Timothy Dixon, male    DOB: 1960-08-25,  MRN: [id number]  Chief Complaint  Patient presents with  . Foot Pain    pt is here for a 4 week f/u of the left heel, pt states that the pain has recently come back, and is looking  to get an injection    59 y.o. male presents with the above complaint.  Patient presents with a follow-up of left plantar fasciitis.  Patient states that hurts when ambulating.  However the pain has gone down considerably.  Patient states the brace helped a lot.  Is still painful to walk on.  The injection wore off after 3 weeks and his pain returned.  Pain scale is 10 out of 10.  Patient would like another injection to help decrease some of the pain.  He denies any other acute complaints.  He has been doing a stretching exercises.   Review of Systems: Negative except as noted in the HPI. Denies N/V/F/Ch.  Past Medical History:  Diagnosis Date  . Anxiety   . Barrett esophagus   . Elevated LFTs   . Esophageal reflux   . Fatty liver   . Gallbladder polyp   . Gastric polyp   . GERD (gastroesophageal reflux disease)   . Hepatic steatosis   . Hiatal hernia   . Hypercholesterolemia   . OSA (obstructive sleep apnea)   . Sleep apnea     Current Outpatient Medications:  .  atorvastatin (LIPITOR) 20 MG tablet, Take 20 mg by mouth daily at 6 PM. , Disp: , Rfl:  .  LUMIFY 0.025 % SOLN, Apply 1 drop to eye every morning., Disp: , Rfl:  .  metFORMIN (GLUCOPHAGE) 1000 MG tablet, Take 1,000 mg by mouth 2 (two) times daily., Disp: , Rfl:  .  metFORMIN (GLUCOPHAGE) 500 MG tablet, Take 1 tablet by mouth 2 (two) times daily with a meal., Disp: , Rfl:  .  RABEprazole (ACIPHEX) 20 MG tablet, TAKE 1 TABLET BY MOUTH  TWICE A DAY, Disp: 180 tablet, Rfl: 1 .  vitamin B-12 (CYANOCOBALAMIN) 1000 MCG tablet, Take 1,000 mcg by mouth daily., Disp: , Rfl:  .  vitamin E 400 UNIT capsule, Take 800 Units by mouth daily. Two capsules daily, Disp: , Rfl:  .   XIIDRA 5 % SOLN, , Disp: , Rfl:  .  meloxicam (MOBIC) 7.5 MG tablet, Take 1 tablet (7.5 mg total) by mouth daily., Disp: 15 tablet, Rfl: 1  Social History   Tobacco Use  Smoking Status Never Smoker  Smokeless Tobacco Never Used    No Known Allergies Objective:   There were no vitals filed for this visit. There is no height or weight on file to calculate BMI. Constitutional Well developed. Well nourished.  Vascular Dorsalis pedis pulses palpable bilaterally. Posterior tibial pulses palpable bilaterally. Capillary refill normal to all digits.  No cyanosis or clubbing noted. Pedal hair growth normal.  Neurologic Normal speech. Oriented to person, place, and time. Epicritic sensation to light touch grossly present bilaterally.  Dermatologic Nails well groomed and normal in appearance. No open wounds. No skin lesions.  Orthopedic: Normal joint ROM without pain or crepitus bilaterally. No visible deformities. Tender to palpation at the calcaneal tuber left. No pain with calcaneal squeeze left. Ankle ROM full range of motion left. Silfverskiold Test: negative bilaterally.   Radiographs: Taken and reviewed. No acute fractures or dislocations. No evidence of stress fracture.  Plantar heel spur absent. Posterior heel spur present.  Assessment:   1. Plantar fasciitis of left foot   2. Pain in left foot   3. Pes planovalgus    Plan:  Patient was evaluated and treated and all questions answered.  Plantar Fasciitis, left - XR reviewed as above.  - Educated on icing and stretching. Instructions given.  -Second injection delivered to the plantar fascia as below. - DME: None - Pharmacologic management: Meloxicam Educated on risks/benefits and proper taking of medication.  Semiflexible pes planus -I discussed with the patient the etiology of pes planus deformity and various treatment options were extensively discussed with the patient.  I believe patient will benefit from  custom-made orthotics to help control the hindfoot motion as well as support the arches of the foot.  I also discussed with the the relationship of pes planus with plantar fasciitis.  Patient states understanding would like to proceed with getting custom-made orthotics. -He will be scheduled to see Liliane Channel for custom-made orthotics  Procedure: Injection Tendon/Ligament Location: Left plantar fascia at the glabrous junction; medial approach. Skin Prep: alcohol Injectate: 0.5 cc 0.5% marcaine plain, 0.5 cc of 1% Lidocaine, 0.5 cc kenalog 10. Disposition: Patient tolerated procedure well. Injection site dressed with a band-aid.  No follow-ups on file.

## 2019-07-05 ENCOUNTER — Other Ambulatory Visit: Payer: Self-pay

## 2019-07-05 ENCOUNTER — Other Ambulatory Visit: Payer: 59 | Admitting: Orthotics

## 2019-07-05 DIAGNOSIS — M722 Plantar fascial fibromatosis: Secondary | ICD-10-CM

## 2019-07-13 ENCOUNTER — Ambulatory Visit: Payer: 59 | Admitting: Podiatry

## 2019-08-02 ENCOUNTER — Ambulatory Visit: Payer: 59 | Admitting: Orthotics

## 2019-08-02 ENCOUNTER — Other Ambulatory Visit: Payer: Self-pay

## 2019-08-02 DIAGNOSIS — Q666 Other congenital valgus deformities of feet: Secondary | ICD-10-CM

## 2019-08-02 DIAGNOSIS — M722 Plantar fascial fibromatosis: Secondary | ICD-10-CM

## 2019-08-02 NOTE — Progress Notes (Signed)
Patient came in today to pick up custom made foot orthotics.  The goals were accomplished and the patient reported no dissatisfaction with said orthotics.  Patient was advised of breakin period and how to report any issues. 

## 2019-08-23 ENCOUNTER — Other Ambulatory Visit: Payer: Self-pay | Admitting: Internal Medicine

## 2019-12-03 ENCOUNTER — Other Ambulatory Visit: Payer: Self-pay | Admitting: Internal Medicine

## 2020-01-30 ENCOUNTER — Other Ambulatory Visit: Payer: Self-pay

## 2020-01-30 ENCOUNTER — Ambulatory Visit (INDEPENDENT_AMBULATORY_CARE_PROVIDER_SITE_OTHER): Payer: BC Managed Care – PPO | Admitting: Podiatry

## 2020-01-30 ENCOUNTER — Encounter: Payer: Self-pay | Admitting: Podiatry

## 2020-01-30 DIAGNOSIS — M7732 Calcaneal spur, left foot: Secondary | ICD-10-CM

## 2020-01-30 DIAGNOSIS — M722 Plantar fascial fibromatosis: Secondary | ICD-10-CM

## 2020-01-30 NOTE — Progress Notes (Signed)
Subjective:  Patient ID: Timothy Dixon, male    DOB: 1961-02-06,  MRN: [id number]  Chief Complaint  Patient presents with  . Plantar Fasciitis    Left heel pain in the center of the heel. Stabbing and burning pain noted x1 week. Injection was helpful in the past with Plantar fasciitis flare-up. Has custom orthotics and wearing them today.     59 y.o. male presents with the above complaint. Patient presents with complaint of left heel pain that started out of nowhere this past few weeks.  Is sharp stabbing burning pain.  He has been wearing his orthotics however he there was a timeframe for a little while he did not wear his orthotics which could have led to the ED aggravation of the plantar fasciitis.  He has done some stretching exercise but is not able to control the flareup.  He would like to do another steroid shot.  He denies any other acute complaints.   Review of Systems: Negative except as noted in the HPI. Denies N/V/F/Ch.  Past Medical History:  Diagnosis Date  . Anxiety   . Barrett esophagus   . Elevated LFTs   . Esophageal reflux   . Fatty liver   . Gallbladder polyp   . Gastric polyp   . GERD (gastroesophageal reflux disease)   . Hepatic steatosis   . Hiatal hernia   . Hypercholesterolemia   . OSA (obstructive sleep apnea)   . Sleep apnea     Current Outpatient Medications:  .  atorvastatin (LIPITOR) 20 MG tablet, Take 20 mg by mouth daily at 6 PM. , Disp: , Rfl:  .  LUMIFY 0.025 % SOLN, Apply 1 drop to eye every morning., Disp: , Rfl:  .  meloxicam (MOBIC) 7.5 MG tablet, Take 1 tablet (7.5 mg total) by mouth daily., Disp: 15 tablet, Rfl: 1 .  metFORMIN (GLUCOPHAGE) 1000 MG tablet, Take 1,000 mg by mouth 2 (two) times daily., Disp: , Rfl:  .  metFORMIN (GLUCOPHAGE) 500 MG tablet, Take 1 tablet by mouth 2 (two) times daily with a meal., Disp: , Rfl:  .  RABEprazole (ACIPHEX) 20 MG tablet, Take 1 tablet by mouth twice daily, Disp: 180 tablet, Rfl: 0 .  vitamin  B-12 (CYANOCOBALAMIN) 1000 MCG tablet, Take 1,000 mcg by mouth daily., Disp: , Rfl:  .  vitamin E 400 UNIT capsule, Take 800 Units by mouth daily. Two capsules daily, Disp: , Rfl:  .  XIIDRA 5 % SOLN, , Disp: , Rfl:   Social History   Tobacco Use  Smoking Status Never Smoker  Smokeless Tobacco Never Used    No Known Allergies Objective:  There were no vitals filed for this visit. There is no height or weight on file to calculate BMI. Constitutional Well developed. Well nourished.  Vascular Dorsalis pedis pulses palpable bilaterally. Posterior tibial pulses palpable bilaterally. Capillary refill normal to all digits.  No cyanosis or clubbing noted. Pedal hair growth normal.  Neurologic Normal speech. Oriented to person, place, and time. Epicritic sensation to light touch grossly present bilaterally.  Dermatologic Nails well groomed and normal in appearance. No open wounds. No skin lesions.  Orthopedic: Normal joint ROM without pain or crepitus bilaterally. No visible deformities. Tender to palpation at the calcaneal tuber left. No pain with calcaneal squeeze left. Ankle ROM diminished range of motion left. Silfverskiold Test: positive left.   Radiographs: None  Assessment:   1. Plantar fasciitis of left foot   2. Heel spur, left  Plan:  Patient was evaluated and treated and all questions answered.  Plantar Fasciitis, left~recurrence - XR reviewed as above.  - Educated on icing and stretching. Instructions given.  - Injection delivered to the plantar fascia as below. - DME: Continue to plantar Fascial Brace - Pharmacologic management: None  Procedure: Injection Tendon/Ligament Location: Left plantar fascia at the glabrous junction; medial approach. Skin Prep: alcohol Injectate: 0.5 cc 0.5% marcaine plain, 0.5 cc of 1% Lidocaine, 0.5 cc kenalog 10. Disposition: Patient tolerated procedure well. Injection site dressed with a band-aid.  No follow-ups on file.

## 2020-02-17 HISTORY — PX: PLANTAR FASCIA SURGERY: SHX746

## 2020-02-27 ENCOUNTER — Other Ambulatory Visit: Payer: Self-pay

## 2020-02-27 ENCOUNTER — Encounter: Payer: Self-pay | Admitting: Podiatry

## 2020-02-27 ENCOUNTER — Ambulatory Visit (INDEPENDENT_AMBULATORY_CARE_PROVIDER_SITE_OTHER): Payer: BC Managed Care – PPO | Admitting: Podiatry

## 2020-02-27 DIAGNOSIS — M7732 Calcaneal spur, left foot: Secondary | ICD-10-CM

## 2020-02-27 DIAGNOSIS — M722 Plantar fascial fibromatosis: Secondary | ICD-10-CM

## 2020-02-27 NOTE — Progress Notes (Signed)
Subjective:  Patient ID: Timothy Dixon, male    DOB: 12/16/1960,  MRN: [id number]  Chief Complaint  Patient presents with  . Plantar Fasciitis    "its really no better"    60 y.o. male presents with the above complaint. Patient presents with complaint of left heel pain that started out of nowhere this past few weeks.  Is sharp stabbing burning pain.  He has been wearing his orthotics however he there was a timeframe for a little while he did not wear his orthotics which could have led to the ED aggravation of the plantar fasciitis.  He has done some stretching exercise but is not able to control the flareup.  He would like to do another steroid shot.  He denies any other acute complaints.   Review of Systems: Negative except as noted in the HPI. Denies N/V/F/Ch.  Past Medical History:  Diagnosis Date  . Anxiety   . Barrett esophagus   . Elevated LFTs   . Esophageal reflux   . Fatty liver   . Gallbladder polyp   . Gastric polyp   . GERD (gastroesophageal reflux disease)   . Hepatic steatosis   . Hiatal hernia   . Hypercholesterolemia   . OSA (obstructive sleep apnea)   . Sleep apnea     Current Outpatient Medications:  .  atorvastatin (LIPITOR) 20 MG tablet, Take 20 mg by mouth daily at 6 PM. , Disp: , Rfl:  .  LUMIFY 0.025 % SOLN, Apply 1 drop to eye every morning., Disp: , Rfl:  .  meloxicam (MOBIC) 7.5 MG tablet, Take 1 tablet (7.5 mg total) by mouth daily., Disp: 15 tablet, Rfl: 1 .  metFORMIN (GLUCOPHAGE) 1000 MG tablet, Take 1,000 mg by mouth 2 (two) times daily., Disp: , Rfl:  .  metFORMIN (GLUCOPHAGE) 500 MG tablet, Take 1 tablet by mouth 2 (two) times daily with a meal., Disp: , Rfl:  .  RABEprazole (ACIPHEX) 20 MG tablet, Take 1 tablet by mouth twice daily, Disp: 180 tablet, Rfl: 0 .  vitamin B-12 (CYANOCOBALAMIN) 1000 MCG tablet, Take 1,000 mcg by mouth daily., Disp: , Rfl:  .  vitamin E 400 UNIT capsule, Take 800 Units by mouth daily. Two capsules daily, Disp:  , Rfl:  .  XIIDRA 5 % SOLN, , Disp: , Rfl:   Social History   Tobacco Use  Smoking Status Never Smoker  Smokeless Tobacco Never Used    No Known Allergies Objective:  There were no vitals filed for this visit. There is no height or weight on file to calculate BMI. Constitutional Well developed. Well nourished.  Vascular Dorsalis pedis pulses palpable bilaterally. Posterior tibial pulses palpable bilaterally. Capillary refill normal to all digits.  No cyanosis or clubbing noted. Pedal hair growth normal.  Neurologic Normal speech. Oriented to person, place, and time. Epicritic sensation to light touch grossly present bilaterally.  Dermatologic Nails well groomed and normal in appearance. No open wounds. No skin lesions.  Orthopedic: Normal joint ROM without pain or crepitus bilaterally. No visible deformities. Tender to palpation at the calcaneal tuber left. No pain with calcaneal squeeze left. Ankle ROM diminished range of motion left. Silfverskiold Test: positive left.   Radiographs: None  Assessment:   1. Plantar fasciitis of left foot   2. Heel spur, left    Plan:  Patient was evaluated and treated and all questions answered.  Plantar Fasciitis, left~recurrence - XR reviewed as above.  - Educated on icing and stretching. Instructions given.  -  Second injection delivered to the plantar fascia as below. - DME: Cam boot - Pharmacologic management: None  -Patient already has orthotics and is functioning well and  Procedure: Injection Tendon/Ligament Location: Left plantar fascia at the glabrous junction; medial approach. Skin Prep: alcohol Injectate: 0.5 cc 0.5% marcaine plain, 0.5 cc of 1% Lidocaine, 0.5 cc kenalog 10. Disposition: Patient tolerated procedure well. Injection site dressed with a band-aid.  No follow-ups on file.

## 2020-03-15 ENCOUNTER — Other Ambulatory Visit: Payer: Self-pay | Admitting: Internal Medicine

## 2020-03-26 ENCOUNTER — Encounter: Payer: Self-pay | Admitting: Podiatry

## 2020-03-26 ENCOUNTER — Other Ambulatory Visit: Payer: Self-pay

## 2020-03-26 ENCOUNTER — Ambulatory Visit (INDEPENDENT_AMBULATORY_CARE_PROVIDER_SITE_OTHER): Payer: BC Managed Care – PPO | Admitting: Podiatry

## 2020-03-26 DIAGNOSIS — M722 Plantar fascial fibromatosis: Secondary | ICD-10-CM | POA: Diagnosis not present

## 2020-03-28 ENCOUNTER — Encounter: Payer: Self-pay | Admitting: Podiatry

## 2020-03-28 NOTE — Progress Notes (Signed)
Subjective:  Patient ID: Timothy Dixon, male    DOB: Aug 03, 1960,  MRN: [id number]  Chief Complaint  Patient presents with  . Plantar Fasciitis    "its doing a little better.  I still have a lot of pain if Im on my foot very long.  I can feel the pulling in my heel and arch and sometimes at night my big toe will sometimes fall under my other toes, like a cramp, but doesn't hurt"    60 y.o. male presents with the above complaint.  Patient presents with a follow-up of left plantar fasciitis.  Patient states that it feels a little bit better but not able to come out of the boot.  He states that there is still some pulling in the arch and the heel.  He feels a tightness of the plantar fascia.  He denies any other acute complaints.   Review of Systems: Negative except as noted in the HPI. Denies N/V/F/Ch.  Past Medical History:  Diagnosis Date  . Anxiety   . Barrett esophagus   . Elevated LFTs   . Esophageal reflux   . Fatty liver   . Gallbladder polyp   . Gastric polyp   . GERD (gastroesophageal reflux disease)   . Hepatic steatosis   . Hiatal hernia   . Hypercholesterolemia   . OSA (obstructive sleep apnea)   . Sleep apnea     Current Outpatient Medications:  .  atorvastatin (LIPITOR) 20 MG tablet, Take 20 mg by mouth daily at 6 PM. , Disp: , Rfl:  .  LUMIFY 0.025 % SOLN, Apply 1 drop to eye every morning., Disp: , Rfl:  .  meloxicam (MOBIC) 7.5 MG tablet, Take 1 tablet (7.5 mg total) by mouth daily., Disp: 15 tablet, Rfl: 1 .  metFORMIN (GLUCOPHAGE) 1000 MG tablet, Take 1,000 mg by mouth 2 (two) times daily., Disp: , Rfl:  .  metFORMIN (GLUCOPHAGE) 500 MG tablet, Take 1 tablet by mouth 2 (two) times daily with a meal., Disp: , Rfl:  .  RABEprazole (ACIPHEX) 20 MG tablet, Take 1 tablet by mouth twice daily, Disp: 180 tablet, Rfl: 2 .  vitamin B-12 (CYANOCOBALAMIN) 1000 MCG tablet, Take 1,000 mcg by mouth daily., Disp: , Rfl:  .  vitamin E 400 UNIT capsule, Take 800 Units by  mouth daily. Two capsules daily, Disp: , Rfl:  .  XIIDRA 5 % SOLN, , Disp: , Rfl:   Social History   Tobacco Use  Smoking Status Never Smoker  Smokeless Tobacco Never Used    No Known Allergies Objective:  There were no vitals filed for this visit. There is no height or weight on file to calculate BMI. Constitutional Well developed. Well nourished.  Vascular Dorsalis pedis pulses palpable bilaterally. Posterior tibial pulses palpable bilaterally. Capillary refill normal to all digits.  No cyanosis or clubbing noted. Pedal hair growth normal.  Neurologic Normal speech. Oriented to person, place, and time. Epicritic sensation to light touch grossly present bilaterally.  Dermatologic Nails well groomed and normal in appearance. No open wounds. No skin lesions.  Orthopedic: Normal joint ROM without pain or crepitus bilaterally. No visible deformities. Tender to palpation at the calcaneal tuber left. No pain with calcaneal squeeze left. Ankle ROM diminished range of motion left. Silfverskiold Test: positive left.   Radiographs: None  Assessment:   1. Plantar fasciitis of left foot    Plan:  Patient was evaluated and treated and all questions answered.  Plantar Fasciitis, left~recurrence - XR  reviewed as above.  - Educated on icing and stretching. Instructions given.  -No further injection delivered to the plantar fascia  - DME: Begin transitioning out of the cam boot and see if there is any improvement. - Pharmacologic management: None -Patient already has orthotics and is functioning well  -If there is no improvement during the transition process out of the cam boot.  I believe patient will benefit from surgical intervention.  I briefly discussed this today.    No follow-ups on file.

## 2020-05-07 ENCOUNTER — Other Ambulatory Visit: Payer: Self-pay

## 2020-05-07 ENCOUNTER — Encounter: Payer: Self-pay | Admitting: Podiatry

## 2020-05-07 ENCOUNTER — Ambulatory Visit (INDEPENDENT_AMBULATORY_CARE_PROVIDER_SITE_OTHER): Payer: BC Managed Care – PPO | Admitting: Podiatry

## 2020-05-07 DIAGNOSIS — M216X2 Other acquired deformities of left foot: Secondary | ICD-10-CM

## 2020-05-07 DIAGNOSIS — M21862 Other specified acquired deformities of left lower leg: Secondary | ICD-10-CM

## 2020-05-07 DIAGNOSIS — M722 Plantar fascial fibromatosis: Secondary | ICD-10-CM

## 2020-05-07 DIAGNOSIS — M62462 Contracture of muscle, left lower leg: Secondary | ICD-10-CM

## 2020-05-07 DIAGNOSIS — Z01818 Encounter for other preprocedural examination: Secondary | ICD-10-CM

## 2020-05-07 NOTE — Progress Notes (Signed)
Subjective:  Patient ID: Timothy Dixon, male    DOB: 10/05/1960,  MRN: [id number]  Chief Complaint  Patient presents with  . Plantar Fasciitis    "it was doing good, but now starting to hurt again in the center of my heel.  It feels like a stone bruise"    60 y.o. male presents with the above complaint.  Patient presents with a follow-up of left plantar fasciitis.  Patient states that his pain is about the same.  The cam boot does make it feel better but is not able to transition out of it.  He denies any other acute complaints.  He still feels a tightness as well as the bruising in the heel.   Review of Systems: Negative except as noted in the HPI. Denies N/V/F/Ch.  Past Medical History:  Diagnosis Date  . Anxiety   . Barrett esophagus   . Elevated LFTs   . Esophageal reflux   . Fatty liver   . Gallbladder polyp   . Gastric polyp   . GERD (gastroesophageal reflux disease)   . Hepatic steatosis   . Hiatal hernia   . Hypercholesterolemia   . OSA (obstructive sleep apnea)   . Sleep apnea     Current Outpatient Medications:  .  atorvastatin (LIPITOR) 20 MG tablet, Take 20 mg by mouth daily at 6 PM. , Disp: , Rfl:  .  LUMIFY 0.025 % SOLN, Apply 1 drop to eye every morning., Disp: , Rfl:  .  meloxicam (MOBIC) 7.5 MG tablet, Take 1 tablet (7.5 mg total) by mouth daily., Disp: 15 tablet, Rfl: 1 .  metFORMIN (GLUCOPHAGE) 1000 MG tablet, Take 1,000 mg by mouth 2 (two) times daily., Disp: , Rfl:  .  metFORMIN (GLUCOPHAGE) 500 MG tablet, Take 1 tablet by mouth 2 (two) times daily with a meal., Disp: , Rfl:  .  RABEprazole (ACIPHEX) 20 MG tablet, Take 1 tablet by mouth twice daily, Disp: 180 tablet, Rfl: 2 .  vitamin B-12 (CYANOCOBALAMIN) 1000 MCG tablet, Take 1,000 mcg by mouth daily., Disp: , Rfl:  .  vitamin E 400 UNIT capsule, Take 800 Units by mouth daily. Two capsules daily, Disp: , Rfl:  .  XIIDRA 5 % SOLN, , Disp: , Rfl:   Social History   Tobacco Use  Smoking Status  Never Smoker  Smokeless Tobacco Never Used    No Known Allergies Objective:  There were no vitals filed for this visit. There is no height or weight on file to calculate BMI. Constitutional Well developed. Well nourished.  Vascular Dorsalis pedis pulses palpable bilaterally. Posterior tibial pulses palpable bilaterally. Capillary refill normal to all digits.  No cyanosis or clubbing noted. Pedal hair growth normal.  Neurologic Normal speech. Oriented to person, place, and time. Epicritic sensation to light touch grossly present bilaterally.  Dermatologic Nails well groomed and normal in appearance. No open wounds. No skin lesions.  Orthopedic: Normal joint ROM without pain or crepitus bilaterally. No visible deformities. Tender to palpation at the calcaneal tuber left. No pain with calcaneal squeeze left. Ankle ROM diminished range of motion left. Silfverskiold Test: positive left.   Radiographs: None  Assessment:   1. Preoperative examination   2. Plantar fasciitis of left foot   3. Gastrocnemius equinus of left lower extremity    Plan:  Patient was evaluated and treated and all questions answered.  Plantar Fasciitis, left with underlying gastrocnemius deformity/equinus -Clinically patient has not improved even with cam boot immobilization.  Patient has  not been able to transition.  He still continues to have the same amount of pain to the left heel.  At this time given that patient has failed all conservative therapy including injections shoe gear modification orthotics I believe patient will benefit from endoscopic plantar fasciotomy with gastrocnemius recession.  I discussed with the patient the benefit of the procedures and all the treatment options were discussed with the patient in extensive detail.  I discussed my surgical planning with the patient and the benefit of doing so.  He states understanding and would like to proceed with the surgery as listed above. -He  will be weightbearing as tolerated in a cam boot after the procedure to the best of his ability -Informed surgical risk consent was reviewed and read aloud to the patient.  I reviewed the films.  I have discussed my findings with the patient in great detail.  I have discussed all risks including but not limited to infection, stiffness, scarring, limp, disability, deformity, damage to blood vessels and nerves, numbness, poor healing, need for braces, arthritis, chronic pain, amputation, death.  All benefits and realistic expectations discussed in great detail.  I have made no promises as to the outcome.  I have provided realistic expectations.  I have offered the patient a 2nd opinion, which they have declined and assured me they preferred to proceed despite the risks -A total of 33 minutes was spent in direct patient care as well as pre and post patient encounter activities.  This includes documentation as well as reviewing patient chart for labs, imaging, past medical, surgical, social, and family history as documented in the EMR.  I have reviewed medication allergies as documented in EMR.  I discussed the etiology of condition and treatment options from conservative to surgical care.  All risks and benefit of the treatment course was discussed in detail.  All questions were answered and return appointment was discussed.  Since the visit completed in an ambulatory/outpatient setting, the patient and/or parent/guardian has been advised to contact the providers office for worsening condition and seek medical treatment and/or call 911 if the patient deems either is necessary.     No follow-ups on file.

## 2020-05-16 ENCOUNTER — Telehealth: Payer: Self-pay

## 2020-05-16 NOTE — Telephone Encounter (Signed)
DOS 05/27/2020  EPF LT - 57897 GASTROCNEMIUS RECESS LT - [id number]  [employer] ST EFFECTIVE DATE - 02/17/2020  PLAN DEDUCTIBLE - $1250.00 W/ $1250.00 REMAINING OUT OF POCKET - $4890.00 W/ $4715.00 REMAINING COPAY $0.00 COINSURANCE - 20% PER SERVICE YEAR  NO AUTH REQUIRED PER WEBSITE    [employer] EFFECTIVE DATE - 02/17/2020  PLAN DEDUCTIBLE - $500.00 W/ $223.87 REMAINING OUT OF POCKET - $2500.00 W/ $2123.87 REMAINING COPAY $0.00 COINSURANCE - 20%   NOTIFICATION/PRIOR AUTHORIZATION NUMBER CASE STATUS CASE STATUS REASON PRIMARY CARE PHYSICIAN [id number] Closed Case Was Managed And Is Now Complete - ADVANCE NOTIFY DATE/TIME ADMISSION NOTIFY DATE/TIME 05/14/2020 02:18 PM CDT - COVERAGE STATUS OVERALL COVERAGE STATUS Covered/Approved 1-2 CODE DESCRIPTION COVERAGE STATUS DECISION DATE [facility] Coverage determination is reflected for the facility admission and is not a guarantee of payment for ongoing services. Covered/Approved 05/15/2020 1 [id number] Gastrocnemius recession (eg, [place] pro more Covered/Approved 05/15/2020 2 [id number] Endoscopic plantar fasciotomy Covered/Approved 05/15/2020

## 2020-05-27 ENCOUNTER — Encounter: Payer: Self-pay | Admitting: Podiatry

## 2020-05-27 ENCOUNTER — Other Ambulatory Visit: Payer: Self-pay | Admitting: Podiatry

## 2020-05-27 DIAGNOSIS — M722 Plantar fascial fibromatosis: Secondary | ICD-10-CM | POA: Diagnosis not present

## 2020-05-27 DIAGNOSIS — M216X2 Other acquired deformities of left foot: Secondary | ICD-10-CM | POA: Diagnosis not present

## 2020-05-27 MED ORDER — IBUPROFEN 800 MG PO TABS: 800.0000 mg | ORAL_TABLET | Freq: Four times a day (QID) | ORAL | 1 refills | Status: DC | PRN

## 2020-05-27 MED ORDER — OXYCODONE-ACETAMINOPHEN 5-325 MG PO TABS: 1.0000 | ORAL_TABLET | ORAL | 0 refills | Status: DC | PRN

## 2020-05-28 ENCOUNTER — Other Ambulatory Visit: Payer: Self-pay

## 2020-05-28 ENCOUNTER — Telehealth: Payer: Self-pay | Admitting: Podiatry

## 2020-05-28 ENCOUNTER — Ambulatory Visit (INDEPENDENT_AMBULATORY_CARE_PROVIDER_SITE_OTHER): Payer: BC Managed Care – PPO | Admitting: Podiatry

## 2020-05-28 DIAGNOSIS — M216X2 Other acquired deformities of left foot: Secondary | ICD-10-CM

## 2020-05-28 DIAGNOSIS — M21862 Other specified acquired deformities of left lower leg: Secondary | ICD-10-CM

## 2020-05-28 DIAGNOSIS — M722 Plantar fascial fibromatosis: Secondary | ICD-10-CM

## 2020-05-28 DIAGNOSIS — Z9889 Other specified postprocedural states: Secondary | ICD-10-CM

## 2020-05-28 NOTE — Telephone Encounter (Signed)
Patients wife called and stated that her husband had a foot procedure with Dr. Posey Pronto. She stated that the his foot is bleeding from the surgical site and its soaked through bandages and in the surgical boot that he is wearing. They were told to call the office if this happen to prevent any infection. Wife is concerned about the amount of blood that has soak through

## 2020-05-28 NOTE — Telephone Encounter (Signed)
The type of procedure that he had done.  It is very common for some blood to soak through especially with ambulation.  As long as he is not actively dripping blood on the floor, it is common for dry blood to soak through the bandage.  If there is concern and they would like to change the bandage out have them come see me or someone else in our office for bandage change.

## 2020-05-28 NOTE — Telephone Encounter (Signed)
I scheduled him to come in this afternoon in [place]

## 2020-05-30 ENCOUNTER — Encounter: Payer: Self-pay | Admitting: Podiatry

## 2020-05-30 NOTE — Progress Notes (Signed)
Subjective:  Patient ID: Timothy Dixon, male    DOB: [birth year],  MRN: [id number]  Chief Complaint  Patient presents with  . Post-op Problem    Patient presents today for dressing change   DOS 05/27/2020      60 y.o. male returns for post-op check.  Patient is a status post gastrocnemius recession and EPF.  He states that he is doing well.  He does have bandages that were soaking through a little bit.  He would like to get them changed out.  He denies any other acute complaints.  Review of Systems: Negative except as noted in the HPI. Denies N/V/F/Ch.  Past Medical History:  Diagnosis Date  . Anxiety   . Barrett esophagus   . Elevated LFTs   . Esophageal reflux   . Fatty liver   . Gallbladder polyp   . Gastric polyp   . GERD (gastroesophageal reflux disease)   . Hepatic steatosis   . Hiatal hernia   . Hypercholesterolemia   . OSA (obstructive sleep apnea)   . Sleep apnea     Current Outpatient Medications:  .  atorvastatin (LIPITOR) 20 MG tablet, Take 20 mg by mouth daily at 6 PM. , Disp: , Rfl:  .  ibuprofen (ADVIL) 800 MG tablet, Take 1 tablet (800 mg total) by mouth every 6 (six) hours as needed., Disp: 60 tablet, Rfl: 1 .  LUMIFY 0.025 % SOLN, Apply 1 drop to eye every morning., Disp: , Rfl:  .  meloxicam (MOBIC) 7.5 MG tablet, Take 1 tablet (7.5 mg total) by mouth daily., Disp: 15 tablet, Rfl: 1 .  metFORMIN (GLUCOPHAGE) 1000 MG tablet, Take 1,000 mg by mouth 2 (two) times daily., Disp: , Rfl:  .  metFORMIN (GLUCOPHAGE) 500 MG tablet, Take 1 tablet by mouth 2 (two) times daily with a meal., Disp: , Rfl:  .  oxyCODONE-acetaminophen (PERCOCET) 5-325 MG tablet, Take 1-2 tablets by mouth every 4 (four) hours as needed for severe pain., Disp: 30 tablet, Rfl: 0 .  RABEprazole (ACIPHEX) 20 MG tablet, Take 1 tablet by mouth twice daily, Disp: 180 tablet, Rfl: 2 .  vitamin B-12 (CYANOCOBALAMIN) 1000 MCG tablet, Take 1,000 mcg by mouth daily., Disp: , Rfl:  .  vitamin E 400  UNIT capsule, Take 800 Units by mouth daily. Two capsules daily, Disp: , Rfl:  .  XIIDRA 5 % SOLN, , Disp: , Rfl:   Social History   Tobacco Use  Smoking Status Never Smoker  Smokeless Tobacco Never Used    No Known Allergies Objective:  There were no vitals filed for this visit. There is no height or weight on file to calculate BMI. Constitutional Well developed. Well nourished.  Vascular Foot warm and well perfused. Capillary refill normal to all digits.   Neurologic Normal speech. Oriented to person, place, and time. Epicritic sensation to light touch grossly present bilaterally.  Dermatologic Skin healing well without signs of infection. Skin edges well coapted without signs of infection.  Orthopedic: Tenderness to palpation noted about the surgical site.   Radiographs: None Assessment:   1. Plantar fasciitis of left foot   2. Gastrocnemius equinus of left lower extremity   3. Status post foot surgery    Plan:  Patient was evaluated and treated and all questions answered.  S/p foot surgery left -Progressing as expected post-operatively. -XR: None -WB Status: Weightbearing as tolerated in surgical shoe -Sutures: Intact.  No clinical signs of dehiscence noted.  No complication noted. -Medications: None -  Foot redressed.  No follow-ups on file.

## 2020-06-04 ENCOUNTER — Ambulatory Visit (INDEPENDENT_AMBULATORY_CARE_PROVIDER_SITE_OTHER): Payer: BC Managed Care – PPO | Admitting: Podiatry

## 2020-06-04 ENCOUNTER — Other Ambulatory Visit: Payer: Self-pay

## 2020-06-04 ENCOUNTER — Encounter: Payer: Self-pay | Admitting: Podiatry

## 2020-06-04 DIAGNOSIS — M216X2 Other acquired deformities of left foot: Secondary | ICD-10-CM

## 2020-06-04 DIAGNOSIS — M21862 Other specified acquired deformities of left lower leg: Secondary | ICD-10-CM

## 2020-06-04 DIAGNOSIS — Z9889 Other specified postprocedural states: Secondary | ICD-10-CM

## 2020-06-04 DIAGNOSIS — M722 Plantar fascial fibromatosis: Secondary | ICD-10-CM

## 2020-06-04 NOTE — Progress Notes (Signed)
Subjective:  Patient ID: Timothy Dixon, male    DOB: [birth year],  MRN: [id number]  Chief Complaint  Patient presents with  . Routine Post Op    POV #1 DOS 05/27/2020 EPF LT, GASTROCNEMIUS RECESS LT      60 y.o. male returns for post-op check.  Patient is a status post gastrocnemius recession and EPF.  He states that he is doing well.  He does have bandages that were soaking through a little bit.  He would like to get them changed out.  He denies any other acute complaints.  Review of Systems: Negative except as noted in the HPI. Denies N/V/F/Ch.  Past Medical History:  Diagnosis Date  . Anxiety   . Barrett esophagus   . Elevated LFTs   . Esophageal reflux   . Fatty liver   . Gallbladder polyp   . Gastric polyp   . GERD (gastroesophageal reflux disease)   . Hepatic steatosis   . Hiatal hernia   . Hypercholesterolemia   . OSA (obstructive sleep apnea)   . Sleep apnea     Current Outpatient Medications:  .  atorvastatin (LIPITOR) 20 MG tablet, Take 20 mg by mouth daily at 6 PM. , Disp: , Rfl:  .  ibuprofen (ADVIL) 800 MG tablet, Take 1 tablet (800 mg total) by mouth every 6 (six) hours as needed., Disp: 60 tablet, Rfl: 1 .  LUMIFY 0.025 % SOLN, Apply 1 drop to eye every morning., Disp: , Rfl:  .  meloxicam (MOBIC) 7.5 MG tablet, Take 1 tablet (7.5 mg total) by mouth daily., Disp: 15 tablet, Rfl: 1 .  metFORMIN (GLUCOPHAGE) 1000 MG tablet, Take 1,000 mg by mouth 2 (two) times daily., Disp: , Rfl:  .  metFORMIN (GLUCOPHAGE) 500 MG tablet, Take 1 tablet by mouth 2 (two) times daily with a meal., Disp: , Rfl:  .  oxyCODONE-acetaminophen (PERCOCET) 5-325 MG tablet, Take 1-2 tablets by mouth every 4 (four) hours as needed for severe pain., Disp: 30 tablet, Rfl: 0 .  RABEprazole (ACIPHEX) 20 MG tablet, Take 1 tablet by mouth twice daily, Disp: 180 tablet, Rfl: 2 .  vitamin B-12 (CYANOCOBALAMIN) 1000 MCG tablet, Take 1,000 mcg by mouth daily., Disp: , Rfl:  .  vitamin E 400 UNIT  capsule, Take 800 Units by mouth daily. Two capsules daily, Disp: , Rfl:  .  XIIDRA 5 % SOLN, , Disp: , Rfl:   Social History   Tobacco Use  Smoking Status Never Smoker  Smokeless Tobacco Never Used    No Known Allergies Objective:  There were no vitals filed for this visit. There is no height or weight on file to calculate BMI. Constitutional Well developed. Well nourished.  Vascular Foot warm and well perfused. Capillary refill normal to all digits.   Neurologic Normal speech. Oriented to person, place, and time. Epicritic sensation to light touch grossly present bilaterally.  Dermatologic Skin healing well without signs of infection. Skin edges well coapted without signs of infection.  Orthopedic: Tenderness to palpation noted about the surgical site.   Radiographs: None Assessment:   1. Plantar fasciitis of left foot   2. Gastrocnemius equinus of left lower extremity   3. Status post foot surgery    Plan:  Patient was evaluated and treated and all questions answered.  S/p foot surgery left -Progressing as expected post-operatively. -XR: None -WB Status: Weightbearing as tolerated in surgical shoe -Sutures: Intact.  No clinical signs of dehiscence noted.  No complication noted. -Medications: None -  Foot redressed.  No follow-ups on file.

## 2020-06-18 ENCOUNTER — Other Ambulatory Visit: Payer: Self-pay

## 2020-06-18 ENCOUNTER — Ambulatory Visit (INDEPENDENT_AMBULATORY_CARE_PROVIDER_SITE_OTHER): Payer: BC Managed Care – PPO | Admitting: Podiatry

## 2020-06-18 ENCOUNTER — Encounter: Payer: Self-pay | Admitting: Podiatry

## 2020-06-18 DIAGNOSIS — M722 Plantar fascial fibromatosis: Secondary | ICD-10-CM

## 2020-06-18 DIAGNOSIS — M21862 Other specified acquired deformities of left lower leg: Secondary | ICD-10-CM

## 2020-06-18 DIAGNOSIS — M216X2 Other acquired deformities of left foot: Secondary | ICD-10-CM

## 2020-06-18 DIAGNOSIS — Z9889 Other specified postprocedural states: Secondary | ICD-10-CM

## 2020-06-18 NOTE — Progress Notes (Signed)
Subjective:  Patient ID: Timothy Dixon, male    DOB: May 19, 1960,  MRN: [id number]  Chief Complaint  Patient presents with  . Routine Post Op     POV #2 DOS 05/27/2020 EPF LT, GASTROCNEMIUS RECESS LT  "its doing good but when I extend my foot, it pulls in my calf"      60 y.o. male returns for post-op check.  Patient is a status post gastrocnemius recession and EPF.  He states that he is doing well.  He does have bandages that were soaking through a little bit.  He would like to get them changed out.  He denies any other acute complaints.  Review of Systems: Negative except as noted in the HPI. Denies N/V/F/Ch.  Past Medical History:  Diagnosis Date  . Anxiety   . Barrett esophagus   . Elevated LFTs   . Esophageal reflux   . Fatty liver   . Gallbladder polyp   . Gastric polyp   . GERD (gastroesophageal reflux disease)   . Hepatic steatosis   . Hiatal hernia   . Hypercholesterolemia   . OSA (obstructive sleep apnea)   . Sleep apnea     Current Outpatient Medications:  .  atorvastatin (LIPITOR) 20 MG tablet, Take 20 mg by mouth daily at 6 PM. , Disp: , Rfl:  .  ibuprofen (ADVIL) 800 MG tablet, Take 1 tablet (800 mg total) by mouth every 6 (six) hours as needed., Disp: 60 tablet, Rfl: 1 .  LUMIFY 0.025 % SOLN, Apply 1 drop to eye every morning., Disp: , Rfl:  .  meloxicam (MOBIC) 7.5 MG tablet, Take 1 tablet (7.5 mg total) by mouth daily., Disp: 15 tablet, Rfl: 1 .  metFORMIN (GLUCOPHAGE) 1000 MG tablet, Take 1,000 mg by mouth 2 (two) times daily., Disp: , Rfl:  .  metFORMIN (GLUCOPHAGE) 500 MG tablet, Take 1 tablet by mouth 2 (two) times daily with a meal., Disp: , Rfl:  .  oxyCODONE-acetaminophen (PERCOCET) 5-325 MG tablet, Take 1-2 tablets by mouth every 4 (four) hours as needed for severe pain., Disp: 30 tablet, Rfl: 0 .  RABEprazole (ACIPHEX) 20 MG tablet, Take 1 tablet by mouth twice daily, Disp: 180 tablet, Rfl: 2 .  vitamin B-12 (CYANOCOBALAMIN) 1000 MCG tablet,  Take 1,000 mcg by mouth daily., Disp: , Rfl:  .  vitamin E 400 UNIT capsule, Take 800 Units by mouth daily. Two capsules daily, Disp: , Rfl:  .  XIIDRA 5 % SOLN, , Disp: , Rfl:   Social History   Tobacco Use  Smoking Status Never Smoker  Smokeless Tobacco Never Used    No Known Allergies Objective:  There were no vitals filed for this visit. There is no height or weight on file to calculate BMI. Constitutional Well developed. Well nourished.  Vascular Foot warm and well perfused. Capillary refill normal to all digits.   Neurologic Normal speech. Oriented to person, place, and time. Epicritic sensation to light touch grossly present bilaterally.  Dermatologic  skin completely epithelialized.  No clinical signs of dehiscence noted.  Orthopedic:  Now tenderness to palpation noted about the surgical site.   Radiographs: None Assessment:   1. Plantar fasciitis of left foot   2. Gastrocnemius equinus of left lower extremity   3. Status post foot surgery    Plan:  Patient was evaluated and treated and all questions answered.  S/p foot surgery left -Progressing as expected post-operatively. -XR: None -WB Status: Transition to regular shoes -Sutures: Removed.  No clinical signs of dehiscence noted.  No complication noted. -Medications: None -Clinically he is doing really well.  I will see him back again in 4 weeks for final follow-up.  No follow-ups on file.

## 2020-07-01 ENCOUNTER — Ambulatory Visit (INDEPENDENT_AMBULATORY_CARE_PROVIDER_SITE_OTHER): Payer: BC Managed Care – PPO | Admitting: Podiatry

## 2020-07-01 ENCOUNTER — Encounter: Payer: Self-pay | Admitting: Podiatry

## 2020-07-01 ENCOUNTER — Other Ambulatory Visit: Payer: Self-pay

## 2020-07-01 DIAGNOSIS — S96812A Strain of other specified muscles and tendons at ankle and foot level, left foot, initial encounter: Secondary | ICD-10-CM | POA: Diagnosis not present

## 2020-07-01 NOTE — Progress Notes (Signed)
  Subjective:  Patient ID: Timothy Dixon, male    DOB: 03-20-1960,  MRN: [id number]  Chief Complaint  Patient presents with  . calf pain    "I was walking in my yard yesterday and felt a pop in my calf.  I had extreme pain.  Now its sharp pains in the back of my calf and goes up to the back of my knee.  I have been icing it, alternating Ibuprofen with Oxycodone for relief"    60 y.o. male presents with the above complaint. History confirmed with patient.  He is 1 month out from EPF and gastrocnemius recession with Dr. Posey Pronto.  Overall doing better than it was yesterday and pain but still painful  Objective:  Physical Exam: warm, good capillary refill, no trophic changes or ulcerative lesions, normal DP and PT pulses and normal sensory exam. Left Foot: He has pain on palpation to the posterior calf proximal to the incision which is well-healed and not hypertrophic, no neuritic symptoms.  Achilles tendon intact he has 5 out of 5 muscle strength to resisted plantarflexion, negative Thompson test. Assessment:   1. Rupture of left plantaris tendon, initial encounter      Plan:  Patient was evaluated and treated and all questions answered.  Evaluated patient and reviewed his postoperative course and discussed with him my clinical exam findings.  Discussed with him that likely what he felt is possibly a rupture of the plantaris tendon, his Achilles tendon is intact and there is no evidence that he is ruptured this.  Its possible that he does also felt a deep Vicryl suture tear in the fascia.  I think this should be self-limiting I reviewed RICE protocol and he will take the ibuprofen he has, I recommended he be in the cam boot for 1 week and then transition out of it as tolerated.  If still painful remain in CAM boot until he sees Dr. Posey Pronto as next visit.  No follow-ups on file.

## 2020-07-05 ENCOUNTER — Other Ambulatory Visit: Payer: Self-pay

## 2020-07-05 ENCOUNTER — Ambulatory Visit (HOSPITAL_COMMUNITY)
Admission: EM | Admit: 2020-07-05 | Discharge: 2020-07-05 | Disposition: A | Discharge status: Home / Self Care | Payer: BC Managed Care – PPO

## 2020-07-05 ENCOUNTER — Encounter (HOSPITAL_BASED_OUTPATIENT_CLINIC_OR_DEPARTMENT_OTHER): Payer: Self-pay | Admitting: Emergency Medicine

## 2020-07-05 ENCOUNTER — Emergency Department (HOSPITAL_BASED_OUTPATIENT_CLINIC_OR_DEPARTMENT_OTHER): Payer: BC Managed Care – PPO

## 2020-07-05 ENCOUNTER — Emergency Department (HOSPITAL_BASED_OUTPATIENT_CLINIC_OR_DEPARTMENT_OTHER)
Admission: EM | Admit: 2020-07-05 | Discharge: 2020-07-05 | Disposition: A | Discharge status: Home / Self Care | Payer: BC Managed Care – PPO | Attending: Emergency Medicine | Admitting: Emergency Medicine

## 2020-07-05 ENCOUNTER — Encounter (HOSPITAL_COMMUNITY): Payer: Self-pay | Admitting: *Deleted

## 2020-07-05 DIAGNOSIS — S8012XA Contusion of left lower leg, initial encounter: Secondary | ICD-10-CM | POA: Diagnosis not present

## 2020-07-05 DIAGNOSIS — I82452 Acute embolism and thrombosis of left peroneal vein: Secondary | ICD-10-CM | POA: Insufficient documentation

## 2020-07-05 DIAGNOSIS — S8992XA Unspecified injury of left lower leg, initial encounter: Secondary | ICD-10-CM | POA: Diagnosis present

## 2020-07-05 DIAGNOSIS — Y9301 Activity, walking, marching and hiking: Secondary | ICD-10-CM | POA: Diagnosis not present

## 2020-07-05 DIAGNOSIS — Y92007 Garden or yard of unspecified non-institutional (private) residence as the place of occurrence of the external cause: Secondary | ICD-10-CM | POA: Diagnosis not present

## 2020-07-05 DIAGNOSIS — M79662 Pain in left lower leg: Secondary | ICD-10-CM | POA: Diagnosis not present

## 2020-07-05 DIAGNOSIS — W1840XA Slipping, tripping and stumbling without falling, unspecified, initial encounter: Secondary | ICD-10-CM | POA: Diagnosis not present

## 2020-07-05 LAB — CBC WITH DIFFERENTIAL/PLATELET
Abs Immature Granulocytes: 0.02 10*3/uL (ref 0.00–0.07)
Basophils Absolute: 0 10*3/uL (ref 0.0–0.1)
Basophils Relative: 1 %
Eosinophils Absolute: 0.2 10*3/uL (ref 0.0–0.5)
Eosinophils Relative: 3 %
HCT: 41.8 % (ref 39.0–52.0)
Hemoglobin: 13.9 g/dL (ref 13.0–17.0)
Immature Granulocytes: 0 %
Lymphocytes Relative: 30 %
Lymphs Abs: 1.7 10*3/uL (ref 0.7–4.0)
MCH: 26.7 pg (ref 26.0–34.0)
MCHC: 33.3 g/dL (ref 30.0–36.0)
MCV: 80.4 fL (ref 80.0–100.0)
Monocytes Absolute: 0.7 10*3/uL (ref 0.1–1.0)
Monocytes Relative: 13 %
Neutro Abs: 3.1 10*3/uL (ref 1.7–7.7)
Neutrophils Relative %: 53 %
Platelets: 148 10*3/uL — ABNORMAL LOW (ref 150–400)
RBC: 5.2 MIL/uL (ref 4.22–5.81)
RDW: 13.3 % (ref 11.5–15.5)
WBC: 5.7 10*3/uL (ref 4.0–10.5)
nRBC: 0 % (ref 0.0–0.2)

## 2020-07-05 LAB — BASIC METABOLIC PANEL
Anion gap: 10 (ref 5–15)
BUN: 16 mg/dL (ref 6–20)
CO2: 24 mmol/L (ref 22–32)
Calcium: 8.8 mg/dL — ABNORMAL LOW (ref 8.9–10.3)
Chloride: 103 mmol/L (ref 98–111)
Creatinine, Ser: 1.07 mg/dL (ref 0.61–1.24)
GFR, Estimated: >60 mL/min (ref >60)
Glucose, Bld: 117 mg/dL — ABNORMAL HIGH (ref 70–99)
Potassium: 3.9 mmol/L (ref 3.5–5.1)
Sodium: 137 mmol/L (ref 135–145)

## 2020-07-05 MED ORDER — APIXABAN 2.5 MG PO TABS
10.0000 mg | ORAL_TABLET | Freq: Two times a day (BID) | ORAL | Status: DC
  Administered 2020-07-05: 10 mg via ORAL
  Filled 2020-07-05: qty 4

## 2020-07-05 MED ORDER — APIXABAN (ELIQUIS) VTE STARTER PACK (10MG AND 5MG): ORAL_TABLET | ORAL | 0 refills | Status: DC

## 2020-07-05 NOTE — ED Notes (Signed)
Pt verbalizes understanding of discharge instructions. Opportunity for questioning and answers were provided. Armand removed by staff, pt discharged from ED to home. Pt educated on Rx.

## 2020-07-05 NOTE — Discharge Instructions (Signed)
Please go to the emergency room for further workup.

## 2020-07-05 NOTE — ED Triage Notes (Signed)
On Sat pt was out in yard and stepped in such away that sever pain occurred to the back of leg. Pt had surgery to same leg April 11 . Pt has seen Ortho since the the leg pain. Pt presents with ortho boot. Pain now is in Lt calf area with swelling . Swelling and redness to calf that is new.

## 2020-07-05 NOTE — ED Provider Notes (Signed)
[facility] Provider Note  CSN: [id number] Arrival date & time: 07/05/20 6144    History Chief Complaint  Patient presents with  . Leg Pain    HPI  Timothy Dixon is a 60 y.o. male had surgery for plantar fasciitis about 40 days ago. He had been doing well postop and was back to wearing just a regular shoe. About a week ago he was walking in his yard when he stumbled in a hole and felt a pop in his L calf. He was in severe pain initially but improved some. He was seen back at Ortho and placed back in his walking boot. Two days ago he noticed increased swelling and bruising to his calf which was new. Seen at [facility] and sent to the ED to rule out DVT. No CP, SOB. Does not take any blood thinners.    Past Medical History:  Diagnosis Date  . Anxiety   . Barrett esophagus   . Elevated LFTs   . Esophageal reflux   . Fatty liver   . Gallbladder polyp   . Gastric polyp   . GERD (gastroesophageal reflux disease)   . Hepatic steatosis   . Hiatal hernia   . Hypercholesterolemia   . OSA (obstructive sleep apnea)   . Sleep apnea     Past Surgical History:  Procedure Laterality Date  . CARDIAC CATHETERIZATION  2010   Normal coronary arteries by cath  . COLONOSCOPY    . ESOPHAGEAL MANOMETRY N/A 09/26/2012   Procedure: ESOPHAGEAL MANOMETRY (EM);  Surgeon: Sable Feil, MD;  Location: [facility] ENDOSCOPY;  Service: Endoscopy;  Laterality: N/A;  . UPPER GASTROINTESTINAL ENDOSCOPY      Family History  Problem Relation Age of Onset  . Diabetes Mother   . Diabetes Father   . Hypertension Brother   . Heart disease Paternal Grandfather   . Heart disease Other   . Colon cancer Neg Hx     Social History   Tobacco Use  . Smoking status: Never Smoker  . Smokeless tobacco: Never Used  Vaping Use  . Vaping Use: Never used  Substance Use Topics  . Alcohol use: Yes    Comment: rare beer  . Drug use: No     Home Medications Prior to Admission  medications   Medication Sig Start Date End Date Taking? Authorizing Provider  APIXABAN Arne Cleveland) VTE STARTER PACK (10MG  AND 5MG ) Take as directed on package: start with two-5mg  tablets twice daily for 7 days. On day 8, switch to one-5mg  tablet twice daily. 07/05/20  Yes Truddie Hidden, MD  atorvastatin (LIPITOR) 20 MG tablet Take 20 mg by mouth daily at 6 PM.  03/03/13  Yes [provider]  ibuprofen (ADVIL) 800 MG tablet Take 1 tablet (800 mg total) by mouth every 6 (six) hours as needed. 05/27/20  Yes Boneta Lucks P, DPM  LUMIFY 0.025 % SOLN Apply 1 drop to eye every morning. 02/07/19  Yes [provider]  metFORMIN (GLUCOPHAGE) 1000 MG tablet Take 1,000 mg by mouth 2 (two) times daily. 04/25/19  Yes [provider]  oxyCODONE-acetaminophen (PERCOCET) 5-325 MG tablet Take 1-2 tablets by mouth every 4 (four) hours as needed for severe pain. 05/27/20  Yes Felipa Furnace, DPM  RABEprazole (ACIPHEX) 20 MG tablet Take 1 tablet by mouth twice daily 03/15/20  Yes Pyrtle, Lajuan Lines, MD  vitamin B-12 (CYANOCOBALAMIN) 1000 MCG tablet Take 1,000 mcg by mouth daily.   Yes [provider]  vitamin E 400 UNIT capsule Take 800 Units by mouth daily. Two capsules daily 04/27/13  Yes Pyrtle, Lajuan Lines, MD  XIIDRA 5 % SOLN  03/12/19  Yes [provider]     Allergies    Patient has no known allergies.   Review of Systems   Review of Systems A comprehensive review of systems was completed and negative except as noted in HPI.    Physical Exam BP 127/89   Pulse 79   Temp 98.4 F (36.9 C) (Oral)   Resp 16   SpO2 98%   Physical Exam Vitals and nursing note reviewed.  Constitutional:      Appearance: Normal appearance.  HENT:     Head: Normocephalic and atraumatic.     Nose: Nose normal.     Mouth/Throat:     Mouth: Mucous membranes are moist.  Eyes:     Extraocular Movements: Extraocular movements intact.     Conjunctiva/sclera: Conjunctivae normal.   Cardiovascular:     Rate and Rhythm: Normal rate.  Pulmonary:     Effort: Pulmonary effort is normal.     Breath sounds: Normal breath sounds.  Abdominal:     General: Abdomen is flat.     Palpations: Abdomen is soft.     Tenderness: There is no abdominal tenderness.  Musculoskeletal:        General: Swelling and tenderness present. Normal range of motion.     Cervical back: Neck supple.     Comments: Swelling, tenderness and bruising to L calf. Distally normal pulses, mild edema of ankle. No tenderness over proximal deep veins  Skin:    General: Skin is warm and dry.  Neurological:     General: No focal deficit present.     Mental Status: He is alert.  Psychiatric:        Mood and Affect: Mood normal.      ED Results / Procedures / Treatments   Labs (all labs ordered are listed, but only abnormal results are displayed) Labs Reviewed  BASIC METABOLIC PANEL - Abnormal; Notable for the following components:      Result Value   Glucose, Bld 117 (*)    Calcium 8.8 (*)    All other components within normal limits  CBC WITH DIFFERENTIAL/PLATELET - Abnormal; Notable for the following components:   Platelets 148 (*)    All other components within normal limits    EKG None   Radiology US Venous Img Lower  Left (DVT Study)  Result Date: 07/05/2020 CLINICAL DATA:  Left posterior calf pain and swelling x1 day. EXAM: LEFT LOWER EXTREMITY VENOUS DOPPLER ULTRASOUND TECHNIQUE: Gray-scale sonography with compression, as well as color and duplex ultrasound, were performed to evaluate the deep venous system(s) from the level of the common femoral vein through the popliteal and proximal calf veins. COMPARISON:  None. FINDINGS: VENOUS Normal compressibility of the LEFT common femoral, superficial femoral, and popliteal veins, as well as the LEFT posterior tibial vein. Abnormal compressibility of the LEFT peroneal vein is seen. Visualized portions of profunda femoral vein and great saphenous  vein unremarkable. A filling defect is seen within the LEFT peroneal vein. No additional filling defects to suggest DVT within the remaining venous structures on grayscale or color Doppler imaging. Doppler waveforms show normal abnormal direction of venous flow, abnormal respiratory plasticity and abnormal response to augmentation within the LEFT peroneal vein. Limited views of the contralateral common femoral vein are unremarkable. OTHER None. Limitations: none IMPRESSION: Findings consistent with DVT  within the LEFT peroneal vein. Electronically Signed   By: Virgina Norfolk M.D.   On: 07/05/2020 20:27    Procedures Procedures  Medications Ordered in the ED Medications  apixaban (ELIQUIS) tablet 10 mg (has no administration in time range)     MDM Rules/Calculators/A&P MDM Check labs and doppler to rule out DVT. Suspect this was a muscle/tendon tear with a deep hematoma that is just not visible from the surface.  ED Course  I have reviewed the triage vital signs and the nursing notes.  Pertinent labs & imaging results that were available during my care of the patient were reviewed by me and considered in my medical decision making (see chart for details).  Clinical Course as of 07/05/20 2059  Fri Jul 05, 2020  1943 CBC is normal.  [CS]  1949 BMP is normal.  [CS]  2056 Doppler is positive for DVT in Peroneal vein. Will begin Eliquis and d/c with Rx for same. PCP follow up for long term management.  [CS]    Clinical Course User Index [CS] Truddie Hidden, MD    Final Clinical Impression(s) / ED Diagnoses Final diagnoses:  Acute deep vein thrombosis (DVT) of left peroneal vein [facility])    Rx / DC Orders ED Discharge Orders         Ordered    APIXABAN (ELIQUIS) VTE STARTER PACK (10MG  AND 5MG )        07/05/20 2059           Truddie Hidden, MD 07/05/20 2059

## 2020-07-05 NOTE — ED Provider Notes (Addendum)
[place]    CSN: [id number] Arrival date & time: 07/05/20  1608      History   Chief Complaint Chief Complaint  Patient presents with  . lt leg pain    HPI Timothy Dixon is a 60 y.o. male.   HPI  Left Leg Pain: Pt reports that on Saturday he was in his yard when he took a misstep which caused pain to a newly repaired leg. Had surgery on April 11th to help with plantar fasciitis. Pt reports that he has seen ortho early last week to discuss this and was placed in an ortho boot. Since yesterday he has noticed calf swelling, calf pain, redness and warmth with a severe throbbing pain of his left calf. This was not present when he was seen by orthopedics. He reports no chest pains or SOB. He has no history of previous DVT/PE/MI/ Stroke and no family history of clotting disease.   Past Medical History:  Diagnosis Date  . Anxiety   . Barrett esophagus   . Elevated LFTs   . Esophageal reflux   . Fatty liver   . Gallbladder polyp   . Gastric polyp   . GERD (gastroesophageal reflux disease)   . Hepatic steatosis   . Hiatal hernia   . Hypercholesterolemia   . OSA (obstructive sleep apnea)   . Sleep apnea     Patient Active Problem List   Diagnosis Date Noted  . Obesity (BMI 30-39.9) 03/16/2013  . Chest pain, atypical 03/16/2013  . OSA (obstructive sleep apnea) 03/02/2013  . NASH (nonalcoholic steatohepatitis) 02/26/2009  . Gallbladder polyp 01/30/2009  . BARRETTS ESOPHAGUS 01/30/2009  . GERD 01/23/2009    Past Surgical History:  Procedure Laterality Date  . CARDIAC CATHETERIZATION  2010   Normal coronary arteries by cath  . COLONOSCOPY    . ESOPHAGEAL MANOMETRY N/A 09/26/2012   Procedure: ESOPHAGEAL MANOMETRY (EM);  Surgeon: Sable Feil, MD;  Location: [facility];  Service: Endoscopy;  Laterality: N/A;  . UPPER GASTROINTESTINAL ENDOSCOPY         Home Medications    Prior to Admission medications   Medication Sig Start Date End Date  Taking? Authorizing Provider  atorvastatin (LIPITOR) 20 MG tablet Take 20 mg by mouth daily at 6 PM.  03/03/13   [provider]  ibuprofen (ADVIL) 800 MG tablet Take 1 tablet (800 mg total) by mouth every 6 (six) hours as needed. 05/27/20   Felipa Furnace, DPM  LUMIFY 0.025 % SOLN Apply 1 drop to eye every morning. 02/07/19   [provider]  meloxicam (MOBIC) 7.5 MG tablet Take 1 tablet (7.5 mg total) by mouth daily. 06/06/19   Felipa Furnace, DPM  metFORMIN (GLUCOPHAGE) 1000 MG tablet Take 1,000 mg by mouth 2 (two) times daily. 04/25/19   [provider]  metFORMIN (GLUCOPHAGE) 500 MG tablet Take 1 tablet by mouth 2 (two) times daily with a meal. 12/03/15   [provider]  oxyCODONE-acetaminophen (PERCOCET) 5-325 MG tablet Take 1-2 tablets by mouth every 4 (four) hours as needed for severe pain. 05/27/20   Felipa Furnace, DPM  RABEprazole (ACIPHEX) 20 MG tablet Take 1 tablet by mouth twice daily 03/15/20   Pyrtle, Lajuan Lines, MD  vitamin B-12 (CYANOCOBALAMIN) 1000 MCG tablet Take 1,000 mcg by mouth daily.    [provider]  vitamin E 400 UNIT capsule Take 800 Units by mouth daily. Two capsules daily 04/27/13   Pyrtle, Lajuan Lines, MD  XIIDRA 5 % SOLN  03/12/19   [provider]    Family History Family History  Problem Relation Age of Onset  . Diabetes Mother   . Diabetes Father   . Hypertension Brother   . Heart disease Paternal Grandfather   . Heart disease Other   . Colon cancer Neg Hx     Social History Social History   Tobacco Use  . Smoking status: Never Smoker  . Smokeless tobacco: Never Used  Vaping Use  . Vaping Use: Never used  Substance Use Topics  . Alcohol use: Yes    Comment: rare beer  . Drug use: No     Allergies   Patient has no known allergies.   Review of Systems Review of Systems  As stated above in HPI Physical Exam Triage Vital Signs ED Triage Vitals  Enc Vitals Group     BP 07/05/20 1655 127/88      Pulse Rate 07/05/20 1655 91     Resp 07/05/20 1655 18     Temp 07/05/20 1655 98.4 F (36.9 C)     Temp src --      SpO2 07/05/20 1655 95 %     Weight --      Height --      Head Circumference --      Peak Flow --      Pain Score 07/05/20 1652 6     Pain Loc --      Pain Edu? --      Excl. in [place]? --    No data found.  Updated Vital Signs BP 127/88   Pulse 91   Temp 98.4 F (36.9 C)   Resp 18   SpO2 95%   Physical Exam Vitals and nursing note reviewed.  Constitutional:      General: He is not in acute distress.    Appearance: Normal appearance. He is not ill-appearing, toxic-appearing or diaphoretic.  HENT:     Head: Normocephalic and atraumatic.  Skin:    Capillary Refill: Capillary refill takes less than 2 seconds.     Comments: Slightly dusky hue of the left leg compared to the right. There is moderate calf tenderness to palpation of the left calf. Slight increased warmth and there is visible ecchymosis. Positive Homans sign.  Calf circumference of the left calf is 19 inches, calf circumference of the right calf is 18.25 inches.   Neurological:     Mental Status: He is alert.      UC Treatments / Results  Labs (all labs ordered are listed, but only abnormal results are displayed) Labs Reviewed - No data to display  EKG   Radiology No results found.  Procedures Procedures (including critical care time)  Medications Ordered in UC Medications - No data to display  Initial Impression / Assessment and Plan / UC Course  I have reviewed the triage vital signs and the nursing notes.  Pertinent labs & imaging results that were available during my care of the patient were reviewed by me and considered in my medical decision making (see chart for details).     New.  Given his history and exam findings I am concerned for a DVT.  I have discussed this with patient.  Patient is also concerned about this is a possibility given his recent history of surgery and  recent injury to the calf area.  For this reason we have elected to have him transferred to the emergency department for further work-up  including a Doppler scan.  He is agreeable. He prefers Secretary/administrator.  Final Clinical Impressions(s) / UC Diagnoses   Final diagnoses:  None   Discharge Instructions   None    ED Prescriptions    None     PDMP not reviewed this encounter.   Hughie Closs, Hershal Coria 07/05/20 1741    Hughie Closs, PA-C 07/05/20 1744

## 2020-07-05 NOTE — ED Triage Notes (Signed)
Surgery to affected left leg 40 days ago. Was seen at [facility] for recurrent pain , sent for DVT study.

## 2020-07-10 ENCOUNTER — Other Ambulatory Visit: Payer: Self-pay

## 2020-07-10 ENCOUNTER — Ambulatory Visit (INDEPENDENT_AMBULATORY_CARE_PROVIDER_SITE_OTHER): Payer: BC Managed Care – PPO | Admitting: Podiatry

## 2020-07-10 DIAGNOSIS — M722 Plantar fascial fibromatosis: Secondary | ICD-10-CM

## 2020-07-10 DIAGNOSIS — M216X2 Other acquired deformities of left foot: Secondary | ICD-10-CM

## 2020-07-10 DIAGNOSIS — M21862 Other specified acquired deformities of left lower leg: Secondary | ICD-10-CM

## 2020-07-11 ENCOUNTER — Encounter: Payer: Self-pay | Admitting: Podiatry

## 2020-07-11 NOTE — Progress Notes (Signed)
Subjective:  Patient ID: Timothy Dixon, male    DOB: August 14, 1960,  MRN: [id number]  Chief Complaint  Patient presents with  . Routine Post Op    PT Stated that he is not doing well he stated that he went to the ER on the 20th and was told he had a blood clot       60 y.o. male returns for post-op check.  Patient is a status post gastrocnemius recession and EPF.  He states that he is doing well.  He does has been recently diagnosed with acute DVT now on anticoagulation.  He states that his pain in the calf in the heel area is doing better however he still has proximal calf pain where he was diagnosed with DVT.  Review of Systems: Negative except as noted in the HPI. Denies N/V/F/Ch.  Past Medical History:  Diagnosis Date  . Anxiety   . Barrett esophagus   . Elevated LFTs   . Esophageal reflux   . Fatty liver   . Gallbladder polyp   . Gastric polyp   . GERD (gastroesophageal reflux disease)   . Hepatic steatosis   . Hiatal hernia   . Hypercholesterolemia   . OSA (obstructive sleep apnea)   . Sleep apnea     Current Outpatient Medications:  .  APIXABAN (ELIQUIS) VTE STARTER PACK (10MG  AND 5MG ), Take as directed on package: start with two-5mg  tablets twice daily for 7 days. On day 8, switch to one-5mg  tablet twice daily., Disp: 1 each, Rfl: 0 .  atorvastatin (LIPITOR) 20 MG tablet, Take 20 mg by mouth daily at 6 PM. , Disp: , Rfl:  .  ibuprofen (ADVIL) 800 MG tablet, Take 1 tablet (800 mg total) by mouth every 6 (six) hours as needed., Disp: 60 tablet, Rfl: 1 .  LUMIFY 0.025 % SOLN, Apply 1 drop to eye every morning., Disp: , Rfl:  .  metFORMIN (GLUCOPHAGE) 1000 MG tablet, Take 1,000 mg by mouth 2 (two) times daily., Disp: , Rfl:  .  oxyCODONE-acetaminophen (PERCOCET) 5-325 MG tablet, Take 1-2 tablets by mouth every 4 (four) hours as needed for severe pain., Disp: 30 tablet, Rfl: 0 .  RABEprazole (ACIPHEX) 20 MG tablet, Take 1 tablet by mouth twice daily, Disp: 180 tablet,  Rfl: 2 .  vitamin B-12 (CYANOCOBALAMIN) 1000 MCG tablet, Take 1,000 mcg by mouth daily., Disp: , Rfl:  .  vitamin E 400 UNIT capsule, Take 800 Units by mouth daily. Two capsules daily, Disp: , Rfl:  .  XIIDRA 5 % SOLN, , Disp: , Rfl:   Social History   Tobacco Use  Smoking Status Never Smoker  Smokeless Tobacco Never Used    No Known Allergies Objective:  There were no vitals filed for this visit. There is no height or weight on file to calculate BMI. Constitutional Well developed. Well nourished.  Vascular Foot warm and well perfused. Capillary refill normal to all digits.   Neurologic Normal speech. Oriented to person, place, and time. Epicritic sensation to light touch grossly present bilaterally.  Dermatologic  skin completely epithelialized.  No clinical signs of dehiscence noted.  Orthopedic:  No tenderness to palpation noted about the surgical site.   Radiographs: None Assessment:   1. Plantar fasciitis of left foot   2. Gastrocnemius equinus of left lower extremity    Plan:  Patient was evaluated and treated and all questions answered.  Acute DVT peroneal -Being primarily managed by PCP.  Patient is on anticoagulation.  S/p  foot surgery left -Progressing as expected post-operatively. -XR: None -WB Status: Transition to regular shoes -Sutures: Removed.  No clinical signs of dehiscence noted.  No complication noted. -Medications: None -Clinically he is doing really well.  I will see him back again in 4 weeks for final follow-up.  No follow-ups on file.

## 2020-07-25 ENCOUNTER — Encounter: Payer: BC Managed Care – PPO | Admitting: Podiatry

## 2020-08-06 ENCOUNTER — Other Ambulatory Visit: Payer: Self-pay

## 2020-08-06 ENCOUNTER — Ambulatory Visit (INDEPENDENT_AMBULATORY_CARE_PROVIDER_SITE_OTHER): Payer: BC Managed Care – PPO | Admitting: Podiatry

## 2020-08-06 DIAGNOSIS — M21862 Other specified acquired deformities of left lower leg: Secondary | ICD-10-CM

## 2020-08-06 DIAGNOSIS — M216X2 Other acquired deformities of left foot: Secondary | ICD-10-CM

## 2020-08-06 DIAGNOSIS — M722 Plantar fascial fibromatosis: Secondary | ICD-10-CM

## 2020-08-06 DIAGNOSIS — Z9889 Other specified postprocedural states: Secondary | ICD-10-CM

## 2020-08-07 ENCOUNTER — Encounter: Payer: Self-pay | Admitting: Podiatry

## 2020-08-07 NOTE — Progress Notes (Signed)
Subjective:  Patient ID: Timothy Dixon, male    DOB: 02-13-1961,  MRN: [id number]  Chief Complaint  Patient presents with   Routine Post Op    POST OP       60 y.o. male returns for post-op check.  Patient is a status post gastrocnemius recession and EPF.  He states that he is doing well.  He does has been recently diagnosed with acute DVT now on anticoagulation.  He states that his pain in the calf in the heel area is doing better however he still has proximal calf pain where he was diagnosed with DVT.  Review of Systems: Negative except as noted in the HPI. Denies N/V/F/Ch.  Past Medical History:  Diagnosis Date   Anxiety    Barrett esophagus    Elevated LFTs    Esophageal reflux    Fatty liver    Gallbladder polyp    Gastric polyp    GERD (gastroesophageal reflux disease)    Hepatic steatosis    Hiatal hernia    Hypercholesterolemia    OSA (obstructive sleep apnea)    Sleep apnea     Current Outpatient Medications:    APIXABAN (ELIQUIS) VTE STARTER PACK (10MG  AND 5MG ), Take as directed on package: start with two-5mg  tablets twice daily for 7 days. On day 8, switch to one-5mg  tablet twice daily., Disp: 1 each, Rfl: 0   atorvastatin (LIPITOR) 20 MG tablet, Take 20 mg by mouth daily at 6 PM. , Disp: , Rfl:    ibuprofen (ADVIL) 800 MG tablet, Take 1 tablet (800 mg total) by mouth every 6 (six) hours as needed., Disp: 60 tablet, Rfl: 1   LUMIFY 0.025 % SOLN, Apply 1 drop to eye every morning., Disp: , Rfl:    metFORMIN (GLUCOPHAGE) 1000 MG tablet, Take 1,000 mg by mouth 2 (two) times daily., Disp: , Rfl:    oxyCODONE-acetaminophen (PERCOCET) 5-325 MG tablet, Take 1-2 tablets by mouth every 4 (four) hours as needed for severe pain., Disp: 30 tablet, Rfl: 0   RABEprazole (ACIPHEX) 20 MG tablet, Take 1 tablet by mouth twice daily, Disp: 180 tablet, Rfl: 2   vitamin B-12 (CYANOCOBALAMIN) 1000 MCG tablet, Take 1,000 mcg by mouth daily., Disp: , Rfl:    vitamin E 400 UNIT  capsule, Take 800 Units by mouth daily. Two capsules daily, Disp: , Rfl:    XIIDRA 5 % SOLN, , Disp: , Rfl:   Social History   Tobacco Use  Smoking Status Never  Smokeless Tobacco Never    No Known Allergies Objective:  There were no vitals filed for this visit. There is no height or weight on file to calculate BMI. Constitutional Well developed. Well nourished.  Vascular Foot warm and well perfused. Capillary refill normal to all digits.   Neurologic Normal speech. Oriented to person, place, and time. Epicritic sensation to light touch grossly present bilaterally.  Dermatologic  skin completely epithelialized.  No clinical signs of dehiscence noted.  Orthopedic:  No tenderness to palpation noted about the surgical site.   Radiographs: None Assessment:   1. Gastrocnemius equinus of left lower extremity   2. Plantar fasciitis of left foot   3. Status post foot surgery     Plan:  Patient was evaluated and treated and all questions answered.  Acute DVT peroneal -Being primarily managed by PCP.  Patient is on anticoagulation.  S/p foot surgery left -Progressing as expected post-operatively. -XR: None -WB Status: Transition to regular shoes -Sutures: Removed.  No  clinical signs of dehiscence noted.  No complication noted. -Medications: None -Clinically is doing well.  His muscular strength is returned back to normal.  He is ambulating without heel pain.  At this time patient is officially discharged from my care if any foot and ankle issues arises he will come back and see me.  No follow-ups on file.

## 2020-11-20 ENCOUNTER — Encounter: Payer: Self-pay | Admitting: Internal Medicine

## 2020-12-15 IMAGING — US US ABDOMEN COMPLETE W/ ELASTOGRAPHY
1 series · 13 of 25 positions shown · non-contrast
Comparison: 05/03/2013
COMPARISON: 05/03/2013

Addendum:
CLINICAL DATA: History of hepatic steatosis and gallbladder polyp.

EXAM:
ULTRASOUND ABDOMEN
ULTRASOUND HEPATIC ELASTOGRAPHY
TECHNIQUE: Sonography of the upper abdomen was performed. In addition,
ultrasound elastography evaluation of the liver was performed. A
region of interest was placed within the right lobe of the liver.
Following application of a compressive sonographic pulse, tissue
compressibility was assessed. Multiple assessments were performed at
the selected site. Median tissue compressibility was determined.
Previously, hepatic stiffness was assessed by shear wave velocity.
Based on recently published Society of Radiologists in Ultrasound
consensus article, reporting is now recommended to be performed in
the SI units of pressure (kiloPascals) representing hepatic
stiffness/elasticity. The obtained result is compared to the
published reference standards. (cACLD= compensated Advanced Chronic
Liver Disease)

[Series 1: us abdomen complete w/ elastography · 13 of 96 slices shown]
[im 1/96]
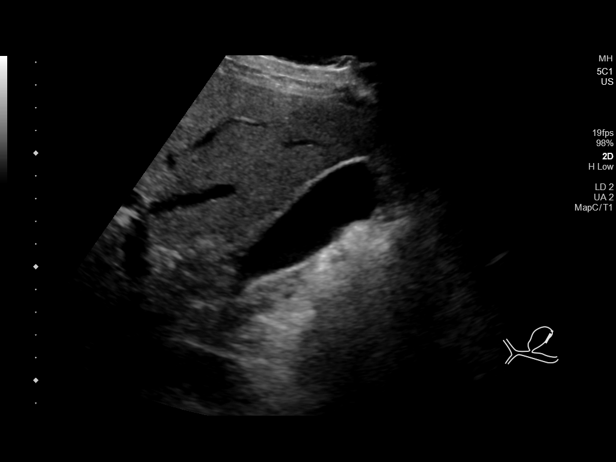
[im 8/96]
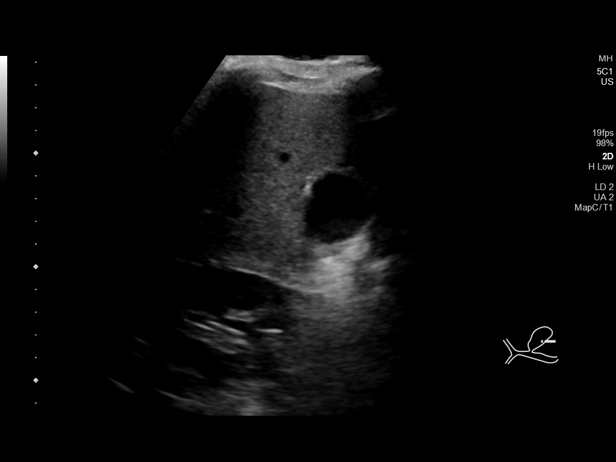
[im 16/96]
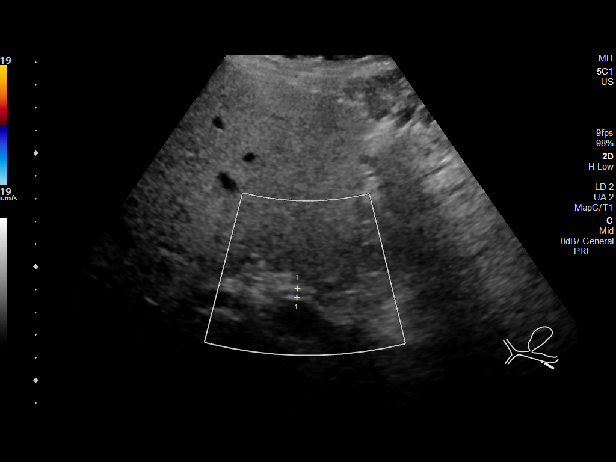
[im 24/96]
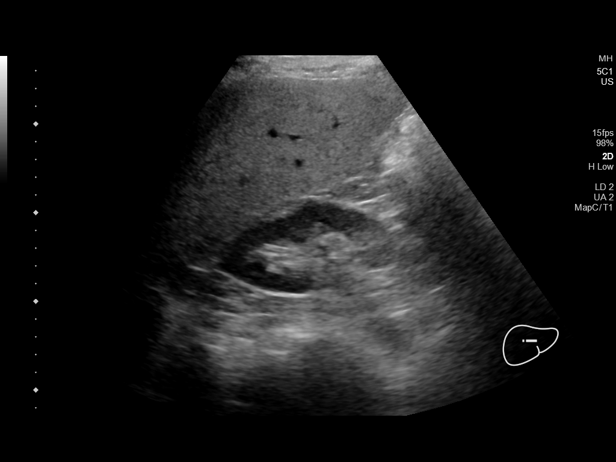
[im 32/96]
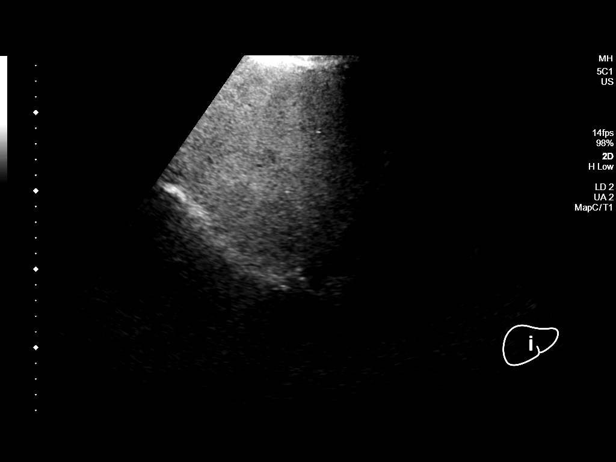
[im 40/96]
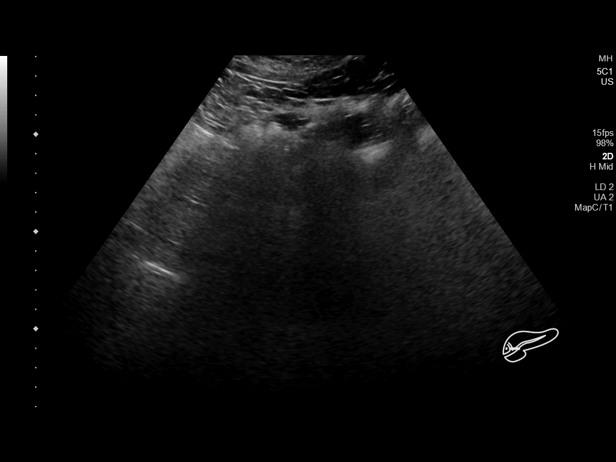
[im 48/96]
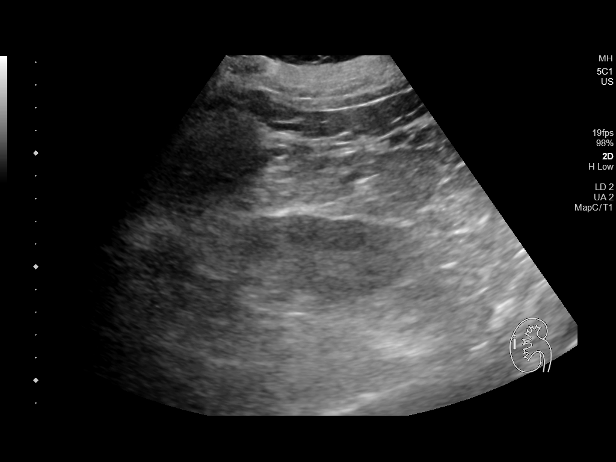
[im 56/96]
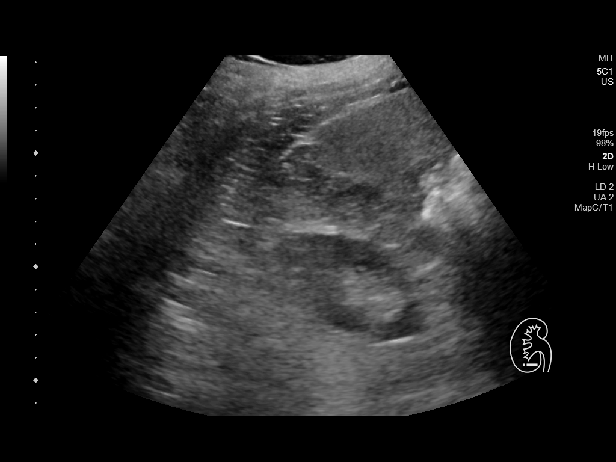
[im 64/96]
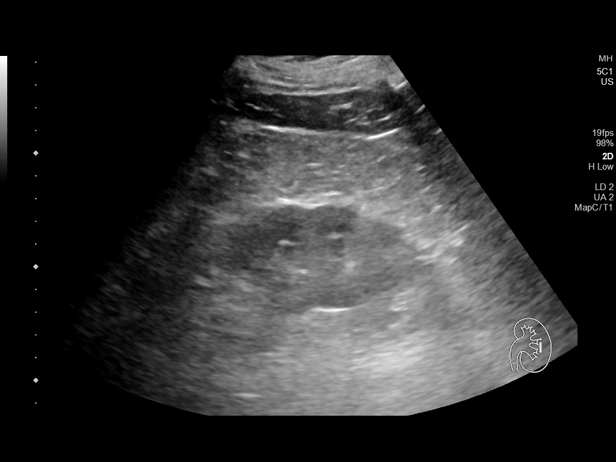
[im 72/96]
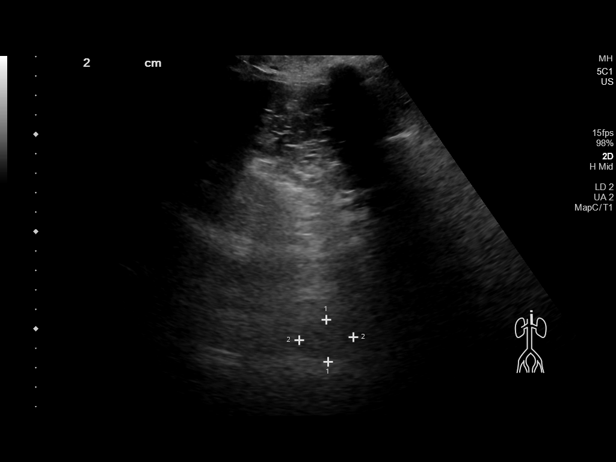
[im 80/96]
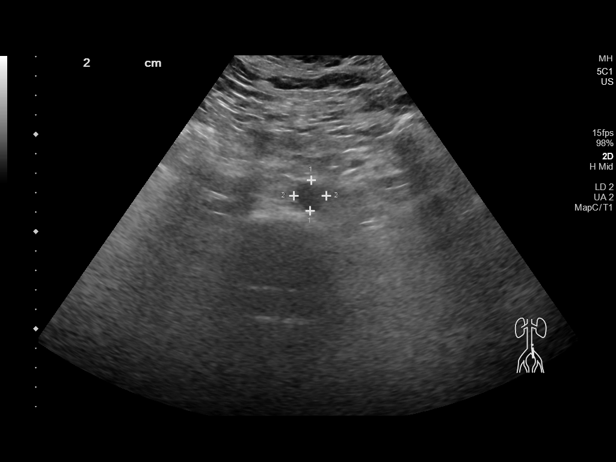
[im 88/96]
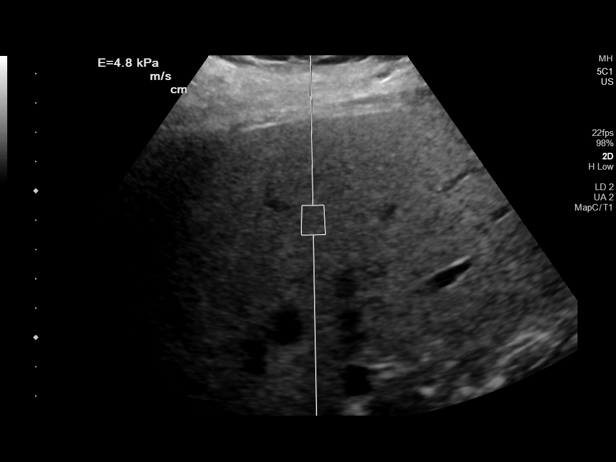
[im 96/96]
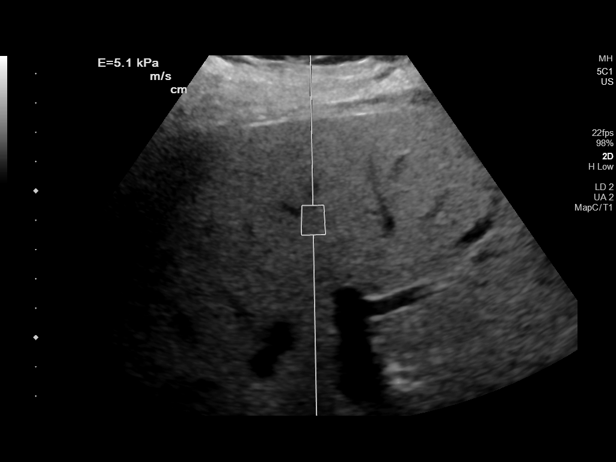

[13 of 25 positions shown; findings below may reference images not displayed]

FINDINGS: ULTRASOUND ABDOMEN

Gallbladder: No signs of wall thickening or reported sonographic
Murphy's sign. At least 1 polyp an area where there was clearly a
single polyp before. Images displaying area that may show
plaque-like thickening of the gallbladder wall in this location that
may measure as much as 1.1 cm in greatest dimension, perhaps 6-7 mm
in thickness.

Common bile duct: Diameter: 4 mm.

Liver: Generalized increased echotexture with mild heterogeneity, no
focal lesion. Portal vein is patent on color Doppler imaging with
normal direction of blood flow towards the liver.

IVC: Normal to the extent visualized.

Pancreas: Obscured by midline bowel gas.

Spleen: Mildly enlarged slightly greater than 12 cm in axial plane.

Right Kidney: Length: 12 cm. Echogenicity within normal limits. No
mass or hydronephrosis visualized.

Left Kidney: Length: 13.3 cm. Echogenicity within normal limits. No
mass or hydronephrosis visualized.

Abdominal aorta: No aneurysm visualized.

Other findings: None.

ULTRASOUND HEPATIC ELASTOGRAPHY

Device: Siemens Helix VTQ

Patient position: Supine

Transducer 5C1

Number of measurements: Choose 1

Hepatic segment:  VIII

Median kPa:

IQR:

IQR/Median kPa ratio:

Data quality:  Reduced accuracy

Diagnostic category:  ?5 kPa: high probability of being normal
IMPRESSION: ULTRASOUND ABDOMEN:

Gallbladder wall thickening, focal with either multiple adjacent
polyp some which may be new compared to the previous study or a
plaque-like area along the wall the gallbladder. Is urine sludge
could also give a similar appearance.

Coarsened hepatic echotexture with signs of hepatic steatosis and
areas of fatty sparing about the gallbladder fossa.

Given above findings of liver disease and potential plaque-like
thickening of the gallbladder with suggest MRI assessment to
determine whether this plaque-like area is enhancing and or
masslike. Surgical consultation may also be warranted given size of
this area.

Gallbladder sludge.

These results will be called to the ordering clinician or
representative by the Radiologist Assistant, and communication
documented in the PACS or zVision Dashboard.

ULTRASOUND HEPATIC ELASTOGRAPHY:

Median kPa:

Diagnostic category:  Choose 1

Data is limited by data quality as discussed above. Elevated
IQR/median kilopascal ratio, above 0.3.

The use of hepatic elastography is applicable to patients with viral
hepatitis and non-alcoholic fatty liver disease. At this time, there
is insufficient data for the referenced cut-off values and use in
other causes of liver disease, including alcoholic liver disease.
Patients, however, may be assessed by elastography and serve as
their own reference standard/baseline.

In patients with non-alcoholic liver disease, the values suggesting
compensated advanced chronic liver disease (cACLD) may be lower, and
patients may need additional testing with elasticity results of [DATE]
kPa.

Please note that abnormal hepatic elasticity and shear wave
velocities may also be identified in clinical settings other than
with hepatic fibrosis, such as: acute hepatitis, elevated right
heart and central venous pressures including use of beta blockers,
Maurer disease (Asmeri), infiltrative processes such as
mastocytosis/amyloidosis/infiltrative tumor/lymphoma, extrahepatic
cholestasis, with hyperemia in the post-prandial state, and with
liver transplantation. Correlation with patient history, laboratory
data, and clinical condition recommended.

ADDENDUM:
Number of measurements above should read 10.

In the conclusion, the first item should read adherent sludge could
also have a similar appearance. MRI with and without contrast is
again suggested to assess for wall enhancement.

*** End of Addendum ***
FINDINGS: ULTRASOUND ABDOMEN

Gallbladder: No signs of wall thickening or reported sonographic
Murphy's sign. At least 1 polyp an area where there was clearly a
single polyp before. Images displaying area that may show
plaque-like thickening of the gallbladder wall in this location that
may measure as much as 1.1 cm in greatest dimension, perhaps 6-7 mm
in thickness.

Common bile duct: Diameter: 4 mm.

Liver: Generalized increased echotexture with mild heterogeneity, no
focal lesion. Portal vein is patent on color Doppler imaging with
normal direction of blood flow towards the liver.

IVC: Normal to the extent visualized.

Pancreas: Obscured by midline bowel gas.

Spleen: Mildly enlarged slightly greater than 12 cm in axial plane.

Right Kidney: Length: 12 cm. Echogenicity within normal limits. No
mass or hydronephrosis visualized.

Left Kidney: Length: 13.3 cm. Echogenicity within normal limits. No
mass or hydronephrosis visualized.

Abdominal aorta: No aneurysm visualized.

Other findings: None.

ULTRASOUND HEPATIC ELASTOGRAPHY

Device: Siemens Helix VTQ

Patient position: Supine

Transducer 5C1

Number of measurements: Choose 1

Hepatic segment:  VIII

Median kPa:

IQR:

IQR/Median kPa ratio:

Data quality:  Reduced accuracy

Diagnostic category:  ?5 kPa: high probability of being normal
IMPRESSION: ULTRASOUND ABDOMEN:

Gallbladder wall thickening, focal with either multiple adjacent
polyp some which may be new compared to the previous study or a
plaque-like area along the wall the gallbladder. Is urine sludge
could also give a similar appearance.

Coarsened hepatic echotexture with signs of hepatic steatosis and
areas of fatty sparing about the gallbladder fossa.

Given above findings of liver disease and potential plaque-like
thickening of the gallbladder with suggest MRI assessment to
determine whether this plaque-like area is enhancing and or
masslike. Surgical consultation may also be warranted given size of
this area.

Gallbladder sludge.

These results will be called to the ordering clinician or
representative by the Radiologist Assistant, and communication
documented in the PACS or zVision Dashboard.

ULTRASOUND HEPATIC ELASTOGRAPHY:

Median kPa:

Diagnostic category:  Choose 1

Data is limited by data quality as discussed above. Elevated
IQR/median kilopascal ratio, above 0.3.

The use of hepatic elastography is applicable to patients with viral
hepatitis and non-alcoholic fatty liver disease. At this time, there
is insufficient data for the referenced cut-off values and use in
other causes of liver disease, including alcoholic liver disease.
Patients, however, may be assessed by elastography and serve as
their own reference standard/baseline.

In patients with non-alcoholic liver disease, the values suggesting
compensated advanced chronic liver disease (cACLD) may be lower, and
patients may need additional testing with elasticity results of [DATE]
kPa.

Please note that abnormal hepatic elasticity and shear wave
velocities may also be identified in clinical settings other than
with hepatic fibrosis, such as: acute hepatitis, elevated right
heart and central venous pressures including use of beta blockers,
Maurer disease (Asmeri), infiltrative processes such as
mastocytosis/amyloidosis/infiltrative tumor/lymphoma, extrahepatic
cholestasis, with hyperemia in the post-prandial state, and with
liver transplantation. Correlation with patient history, laboratory
data, and clinical condition recommended.

## 2021-01-10 ENCOUNTER — Other Ambulatory Visit: Payer: Self-pay | Admitting: Internal Medicine

## 2021-02-11 ENCOUNTER — Encounter: Payer: Self-pay | Admitting: *Deleted

## 2021-02-17 IMAGING — MR MR ABDOMEN WO/W CM MRCP
18 of 19 series · 43 of 48 positions shown · IV contrast (multihance)
Comparison: Ultrasound on 12/21/2018

CLINICAL DATA: Abnormal gallbladder with possible polypoid mass on
recent ultrasound. Diffuse hepatocellular disease.

EXAM:
MRI ABDOMEN WITHOUT AND WITH CONTRAST (INCLUDING MRCP)
TECHNIQUE: Multiplanar multisequence MR imaging of the abdomen was performed
both before and after the administration of intravenous contrast.
Heavily T2-weighted images of the biliary and pancreatic ducts were
obtained, and three-dimensional MRCP images were rendered by post
processing.
CONTRAST:  20mL MULTIHANCE GADOBENATE DIMEGLUMINE 529 MG/ML IV SOLN

[Series 4: T2 · axial · 5.0mm · 1.76mm/px · 1 of 38 slices shown (1 of 4)]
[im 1/38]
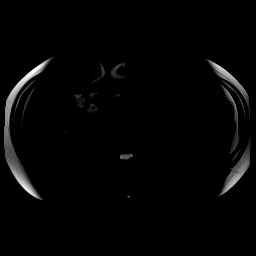

[Series 5: DWI · axial · 5.0mm · 1.68mm/px · z∈[-100,+127]mm · 4 of 108 slices shown (1 of 2)]
[im 1/108]
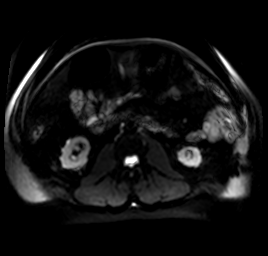
[im 36/108]
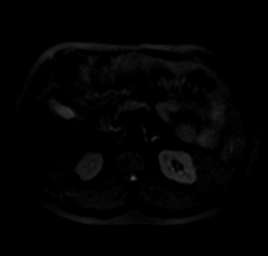
[im 72/108]
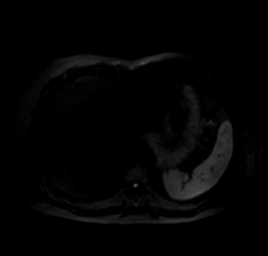
[im 108/108]
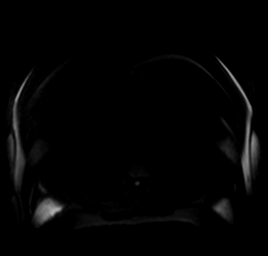

[Series 6: DWI · axial · 5.0mm · 1.68mm/px · 1 of 36 slices shown (2 of 2)]
[im 1/36]
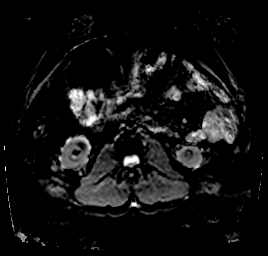

[Series 9: MRCP · coronal · 1.0mm · 0.49mm/px · 2 of 64 slices shown]
[im 1/64]
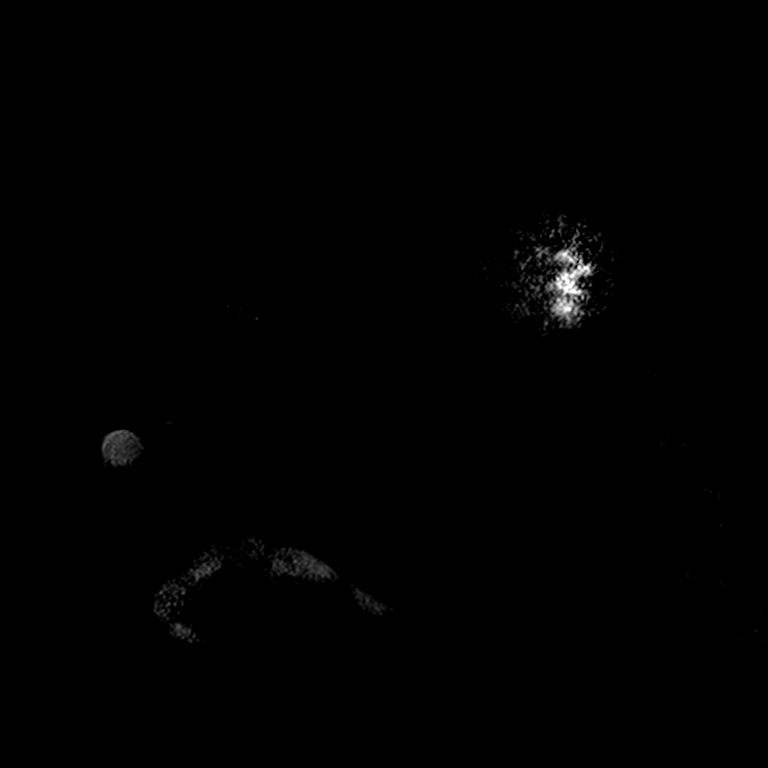
[im 64/64]
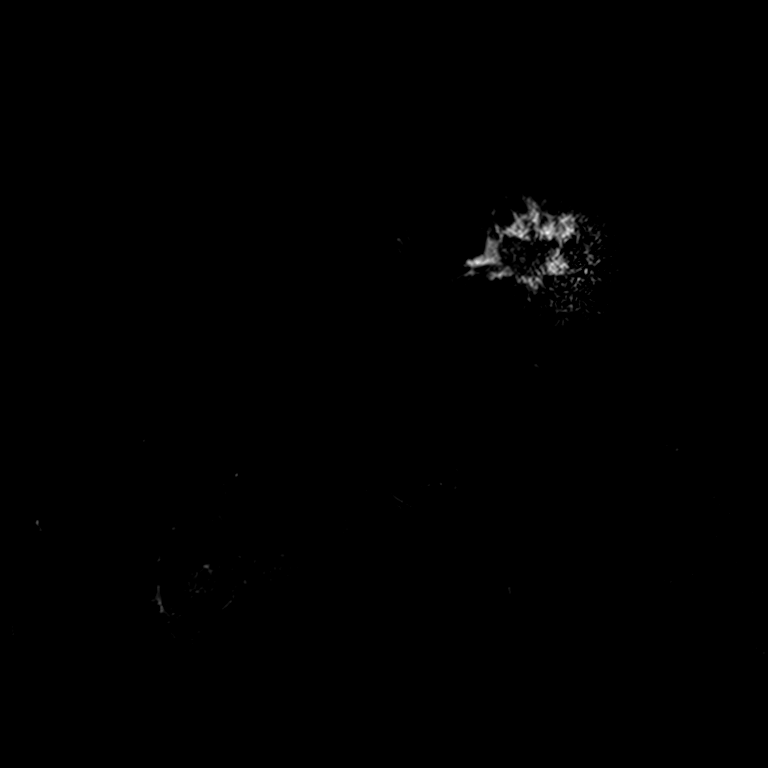

[Series 11: T2 · coronal · 5.0mm · 1.84mm/px · 1 of 36 slices shown (2 of 4)]
[im 1/36]
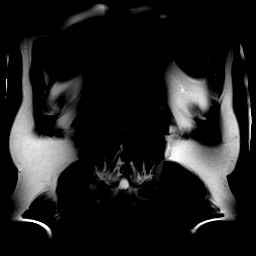

[Series 13: T1 · axial · 3.0mm · 1.41mm/px · z∈[-132,+81]mm · 6 of 144 slices shown]
[im 1/144]
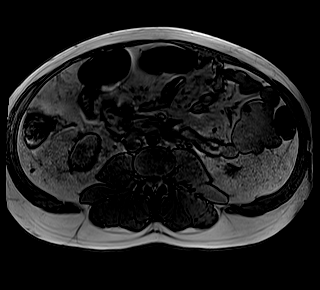
[im 29/144]
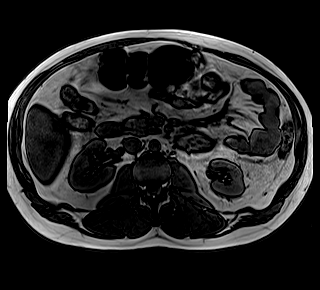
[im 58/144]
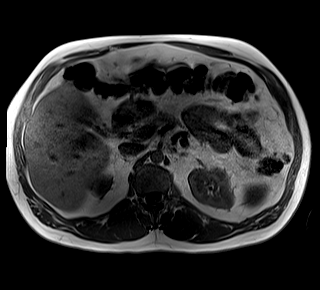
[im 86/144]
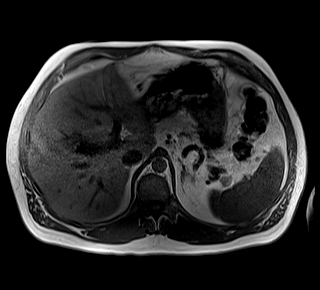
[im 115/144]
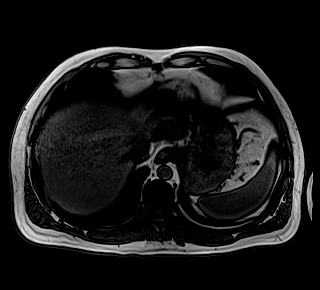
[im 144/144]
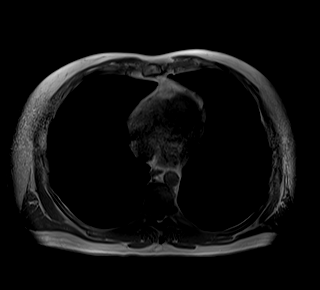

[Series 14: T2 · axial · 6.0mm · 1.41mm/px · 1 of 30 slices shown (3 of 4)]
[im 1/30]
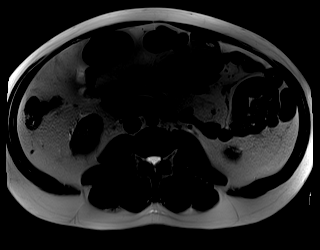

[Series 15: bSSFP · axial · 5.0mm · 1.41mm/px · 1 of 38 slices shown]
[im 1/38]
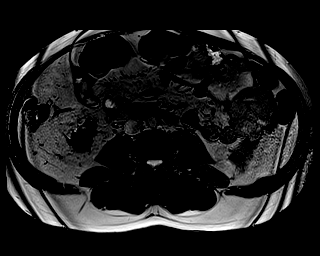

[Series 16: T2 · coronal · 3.0mm · 1.25mm/px · 1 of 19 slices shown (4 of 4)]
[im 1/19]
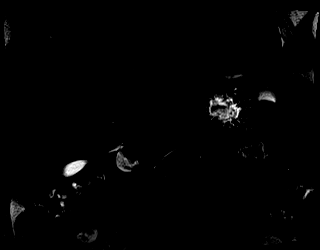

[Series 17: T1 dynamic · axial · non-contrast · 3.0mm · 1.41mm/px · z∈[-132,+81]mm · 3 of 72 slices shown]
[im 1/72]
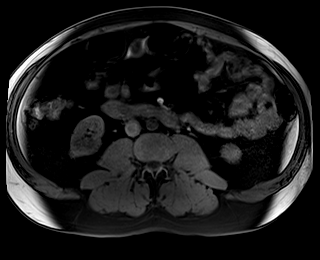
[im 36/72]
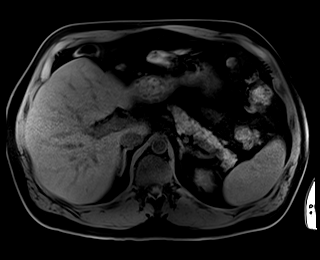
[im 72/72]
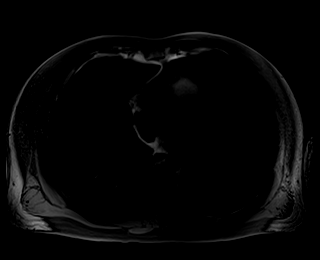

[Series 18: T1 dynamic post-contrast · axial · 3.0mm · 1.41mm/px · z∈[-132,+81]mm · 3 of 72 slices shown (1 of 8)]
[im 1/72]
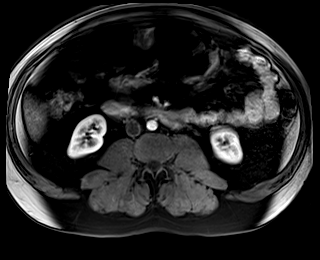
[im 36/72]
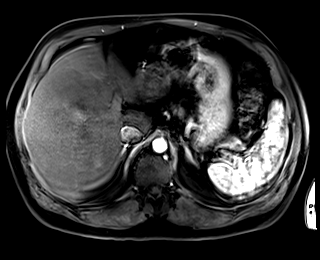
[im 72/72]
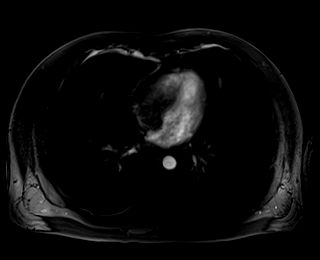

[Series 19: T1 dynamic post-contrast · axial · 3.0mm · 1.41mm/px · z∈[-132,+81]mm · 3 of 72 slices shown (2 of 8)]
[im 1/72]
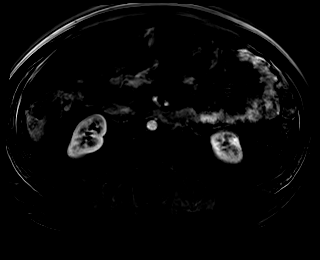
[im 36/72]
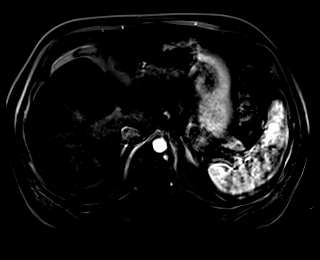
[im 72/72]
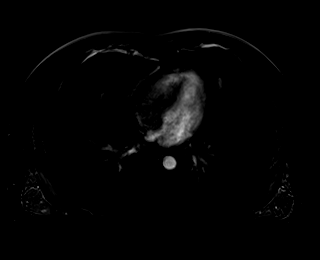

[Series 20: T1 dynamic post-contrast · axial · 3.0mm · 1.41mm/px · z∈[-132,+81]mm · 3 of 72 slices shown (3 of 8)]
[im 1/72]
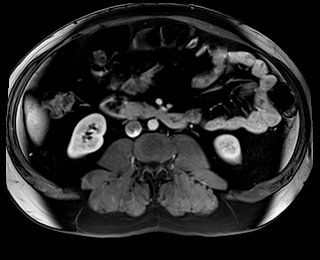
[im 36/72]
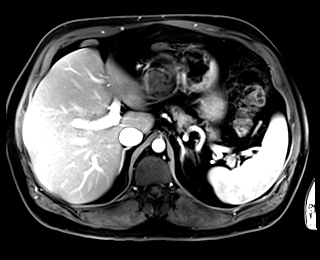
[im 72/72]
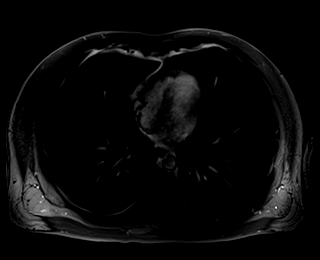

[Series 21: T1 dynamic post-contrast · axial · 3.0mm · 1.41mm/px · z∈[-132,+81]mm · 3 of 72 slices shown (4 of 8)]
[im 1/72]
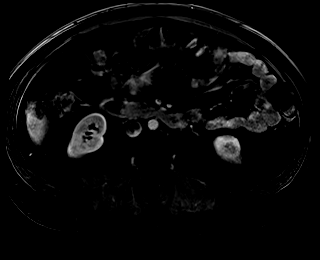
[im 36/72]
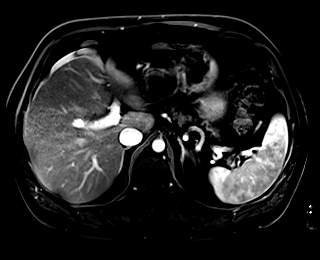
[im 72/72]
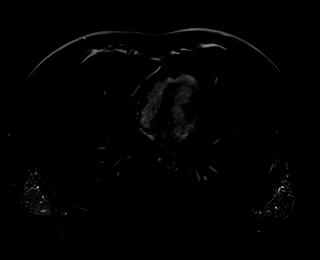

[Series 22: T1 dynamic post-contrast · axial · 3.0mm · 1.41mm/px · z∈[-132,+81]mm · 3 of 72 slices shown (5 of 8)]
[im 1/72]
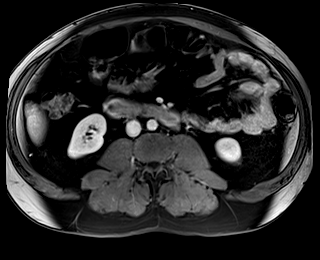
[im 36/72]
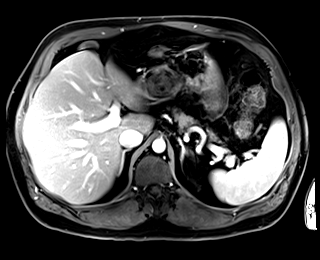
[im 72/72]
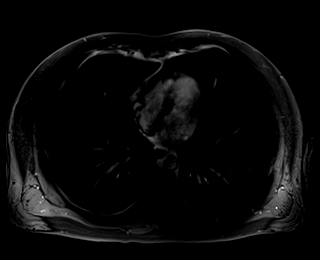

[Series 23: T1 dynamic post-contrast · axial · 3.0mm · 1.41mm/px · z∈[-132,+81]mm · 3 of 72 slices shown (6 of 8)]
[im 1/72]
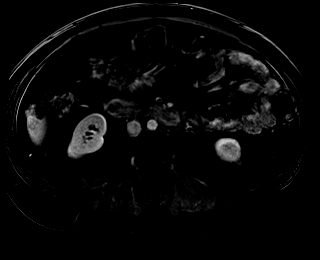
[im 36/72]
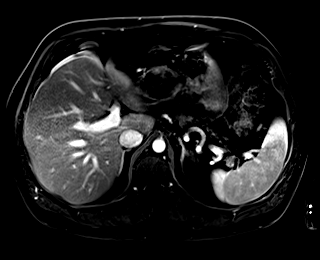
[im 72/72]
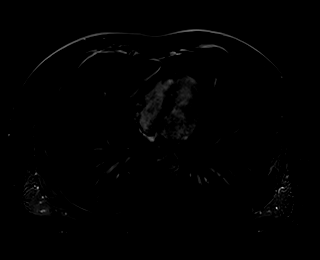

[Series 24: T1 dynamic post-contrast · coronal · 3.0mm · 1.41mm/px · 3 of 72 slices shown (7 of 8)]
[im 1/72]
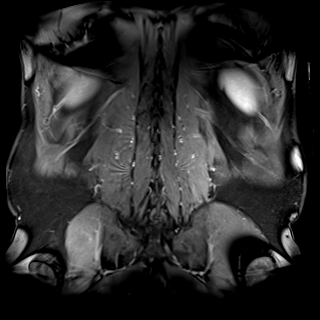
[im 36/72]
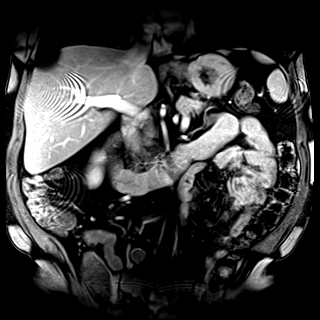
[im 72/72]
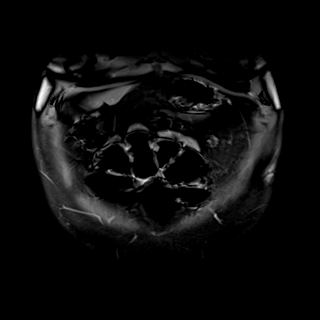

[Series 25: T1 dynamic post-contrast · axial · 3.0mm · 1.41mm/px · 1 of 72 slices shown (8 of 8)]
[im 1/72]
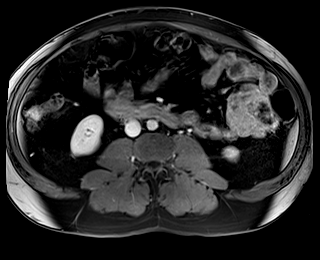

[43 of 48 positions shown; findings below may reference images not displayed]

FINDINGS: Lower chest: No acute findings.

Hepatobiliary: A 1.4 cm benign hemangioma is seen in segment 2 of
the left lobe. No other liver masses are identified.
Mild-to-moderate diffuse hepatic steatosis is seen. Gallbladder is
unremarkable in appearance. There is no evidence of a focal
gallbladder mass or polyp, abnormal wall thickening, or other signs
of cholecystitis. No evidence of biliary ductal dilatation or
choledocholithiasis.

Pancreas: No mass or inflammatory changes. No evidence of pancreatic
ductal dilatation or pancreas divisum.

Spleen:  Within normal limits in size and appearance.

Adrenals/Urinary Tract: No masses identified. No evidence of
hydronephrosis.

Stomach/Bowel: Visualized portion unremarkable.

Vascular/Lymphatic: No pathologically enlarged lymph nodes
identified. No abdominal aortic aneurysm.

Other:  None.

Musculoskeletal:  No suspicious bone lesions identified.
IMPRESSION: Normal appearance of gallbladder. No evidence of gallbladder
neoplasm or cholecystitis.

Mild-to-moderate hepatic steatosis.

1.4 cm benign hemangioma in left hepatic lobe.

## 2021-03-26 ENCOUNTER — Ambulatory Visit (INDEPENDENT_AMBULATORY_CARE_PROVIDER_SITE_OTHER): Payer: BC Managed Care – PPO | Admitting: Internal Medicine

## 2021-03-26 ENCOUNTER — Encounter: Payer: Self-pay | Admitting: Internal Medicine

## 2021-03-26 ENCOUNTER — Other Ambulatory Visit (INDEPENDENT_AMBULATORY_CARE_PROVIDER_SITE_OTHER): Payer: BC Managed Care – PPO

## 2021-03-26 VITALS — BP 120/70 | HR 86 | Ht 77.0 in | Wt 281.0 lb

## 2021-03-26 DIAGNOSIS — K760 Fatty (change of) liver, not elsewhere classified: Secondary | ICD-10-CM

## 2021-03-26 DIAGNOSIS — R1310 Dysphagia, unspecified: Secondary | ICD-10-CM

## 2021-03-26 DIAGNOSIS — K219 Gastro-esophageal reflux disease without esophagitis: Secondary | ICD-10-CM

## 2021-03-26 LAB — CBC WITH DIFFERENTIAL/PLATELET
Basophils Absolute: 0 10*3/uL (ref 0.0–0.1)
Basophils Relative: 0.4 % (ref 0.0–3.0)
Eosinophils Absolute: 0.1 10*3/uL (ref 0.0–0.7)
Eosinophils Relative: 2.1 % (ref 0.0–5.0)
HCT: 43 % (ref 39.0–52.0)
Hemoglobin: 14.7 g/dL (ref 13.0–17.0)
Lymphocytes Relative: 32.4 % (ref 12.0–46.0)
Lymphs Abs: 1.9 10*3/uL (ref 0.7–4.0)
MCHC: 34.2 g/dL (ref 30.0–36.0)
MCV: 78.8 fl (ref 78.0–100.0)
Monocytes Absolute: 0.7 10*3/uL (ref 0.1–1.0)
Monocytes Relative: 11.5 % (ref 3.0–12.0)
Neutro Abs: 3.1 10*3/uL (ref 1.4–7.7)
Neutrophils Relative %: 53.6 % (ref 43.0–77.0)
Platelets: 153 10*3/uL (ref 150.0–400.0)
RBC: 5.46 Mil/uL (ref 4.22–5.81)
RDW: 14 % (ref 11.5–15.5)
WBC: 5.8 10*3/uL (ref 4.0–10.5)

## 2021-03-26 LAB — PROTIME-INR
INR: 1 ratio (ref 0.8–1.0)
Prothrombin Time: 10.7 s (ref 9.6–13.1)

## 2021-03-26 NOTE — Progress Notes (Signed)
Subjective:    Patient ID: Timothy Dixon, male    DOB: Jul 13, 1960, 61 y.o.   MRN: [id number]  HPI Cardell Rachel is a 61 year old male with a history of GERD, remote Barrett's esophagus not seen on most recent EGD, history of hiatal hernia, esophageal dysphagia responsive to prior dilation, history of hyperplastic and fundic gland gastric polyps, NAFLD, hyperlipidemia and provoked DVT now off anticoagulation who is here for follow-up.  Last seen in October 2020.  Here alone today.  He reports that he is doing well.  He continues Aciphex 20 mg twice daily which is working very well for him.  He is not having breakthrough heartburn.  He does intermittently have pill dysphagia which had previously gotten better after dilation in the past.  No abdominal pain.  No nausea or vomiting.  Normal bowel movements.  No blood in stool or melena.  He does continue Vitamin E.  No jaundice, itching, increasing abdominal girth or problem with edema.  He reports that his lab work has been good with primary care though his triglycerides have been slightly elevated.  He does continue the atorvastatin daily.  He was treated for DVT with Xarelto for 3 months.  He developed a left lower extremity DVT after having left foot surgery for plantar fasciitis.  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  He is thinking about retiring completely.  His family is doing well.  His daughter is 39 and continues her efforts to go to med school.  She is interested in being a pediatrician.  He has a son at [facility] state.   Review of Systems As per HPI, otherwise negative  Current Medications, Allergies, Past Medical History, Past Surgical History, Family History and Social History were reviewed in [employer] record.     Objective:   Physical Exam BP 120/70    Pulse 86    Ht 6\' 5"  (1.956 m)    Wt 281 lb (127.5 kg)    SpO2 97%    BMI  33.32 kg/m  Gen: awake, alert, NAD HEENT: anicteric CV: RRR, no mrg Pulm: CTA b/l Abd: soft, NT/ND, +BS throughout, no hepatosplenomegaly Ext: no c/c/e Neuro: nonfocal  MRI ABDOMEN WITHOUT AND WITH CONTRAST (INCLUDING MRCP)   TECHNIQUE: Multiplanar multisequence MR imaging of the abdomen was performed both before and after the administration of intravenous contrast. Heavily T2-weighted images of the biliary and pancreatic ducts were obtained, and three-dimensional MRCP images were rendered by post processing.   CONTRAST:  23mL MULTIHANCE GADOBENATE DIMEGLUMINE 529 MG/ML IV SOLN   COMPARISON:  Ultrasound on 12/21/2018   FINDINGS: Lower chest: No acute findings.   Hepatobiliary: A 1.4 cm benign hemangioma is seen in segment 2 of the left lobe. No other liver masses are identified. Mild-to-moderate diffuse hepatic steatosis is seen. Gallbladder is unremarkable in appearance. There is no evidence of a focal gallbladder mass or polyp, abnormal wall thickening, or other signs of cholecystitis. No evidence of biliary ductal dilatation or choledocholithiasis.   Pancreas: No mass or inflammatory changes. No evidence of pancreatic ductal dilatation or pancreas divisum.   Spleen:  Within normal limits in size and appearance.   Adrenals/Urinary Tract: No masses identified. No evidence of hydronephrosis.   Stomach/Bowel: Visualized portion unremarkable.   Vascular/Lymphatic: No pathologically enlarged lymph nodes identified. No abdominal aortic aneurysm.   Other:  None.   Musculoskeletal:  No suspicious bone lesions identified.  IMPRESSION: Normal appearance of gallbladder. No evidence of gallbladder neoplasm or cholecystitis.   Mild-to-moderate hepatic steatosis.   1.4 cm benign hemangioma in left hepatic lobe.     Electronically Signed   By: Marlaine Hind M.D.   On: 02/24/2019 08:24       Assessment & Plan:  61 year old male with a history of GERD, remote  Barrett's esophagus not seen on most recent EGD, history of hiatal hernia, esophageal dysphagia responsive to prior dilation, history of hyperplastic and fundic gland gastric polyps, NAFLD, hyperlipidemia and provoked DVT now off anticoagulation who is here for follow-up.   GERD with history of hiatal hernia/pill dysphagia/remote Barrett's esophagus --symptoms very well controlled with Aciphex twice daily though has had some minor and intermittent recurrent pill dysphagia. --Continue Aciphex 20 mg twice daily AC --We will schedule EGD, see #2; anticipate repeat dilation as before which is helped pill dysphagia  2.  History of gastric polyps --history of fundic gland polyps but also hyperplastic polyps.  We are repeating upper endoscopy for surveillance.  We reviewed the risk, benefits and alternatives and he is agreeable and wishes to proceed --EGD in the [place]  3.  Nonalcoholic fatty liver disease --prior elastography did not suggest advanced fibrosis.  When last checked his liver enzymes were normal and he reports his primary care does keep an eye on his blood work every 6 months.  We reviewed the importance of risk factor modification including healthy diet, regular exercise and controlling blood pressure, blood sugar and blood cholesterol and triglyceride levels. --CBC, CMP and INR --Continue vitamin D 800 IU daily --Risk factor modification as above --Eventual repeat ultrasound with elastography; suggestion 2024 2025  5.  CRC screening --hyperplastic rectal polyp only in February 2018; repeat colonoscopy recommended for screening in February 2020  30 minutes total spent today including patient facing time, coordination of care, reviewing medical history/procedures/pertinent radiology studies, and documentation of the encounter.

## 2021-03-26 NOTE — Patient Instructions (Addendum)
You have been scheduled for an endoscopy. Please follow written instructions given to you at your visit today. If you use inhalers (even only as needed), please bring them with you on the day of your procedure.  Continue Aciphex twice daily.   Continue Vitamin E 800mg  daily.   Your provider has requested that you go to the basement level for lab work before leaving today. Press "B" on the elevator. The lab is located at the first door on the left as you exit the elevator.  If you are age 21 or older, your body mass index should be between 23-30. Your Body mass index is 33.32 kg/m. If this is out of the aforementioned range listed, please consider follow up with your Primary Care Provider.  If you are age 68 or younger, your body mass index should be between 19-25. Your Body mass index is 33.32 kg/m. If this is out of the aformentioned range listed, please consider follow up with your Primary Care Provider.   ________________________________________________________  The [facility] providers would like to encourage you to use [facility] to communicate with providers for non-urgent requests or questions.  Due to long hold times on the telephone, sending your provider a message by [facility] may be a faster and more efficient way to get a response.  Please allow 48 business hours for a response.  Please remember that this is for non-urgent requests.  _______________________________________________________  Thank you for choosing me and [facility].  Dr. Ulice Dash Pyrtle

## 2021-03-27 LAB — COMPREHENSIVE METABOLIC PANEL
ALT: 54 U/L — ABNORMAL HIGH (ref 0–53)
AST: 29 U/L (ref 0–37)
Albumin: 4.3 g/dL (ref 3.5–5.2)
Alkaline Phosphatase: 79 U/L (ref 39–117)
BUN: 21 mg/dL (ref 6–23)
CO2: 27 mEq/L (ref 19–32)
Calcium: 9.6 mg/dL (ref 8.4–10.5)
Chloride: 105 mEq/L (ref 96–112)
Creatinine, Ser: 1.1 mg/dL (ref 0.40–1.50)
GFR: 74.2 mL/min (ref >60.00)
Glucose, Bld: 152 mg/dL — ABNORMAL HIGH (ref 70–99)
Potassium: 4.4 mEq/L (ref 3.5–5.1)
Sodium: 139 mEq/L (ref 135–145)
Total Bilirubin: 0.6 mg/dL (ref 0.2–1.2)
Total Protein: 7.2 g/dL (ref 6.0–8.3)

## 2021-03-31 ENCOUNTER — Encounter: Payer: Self-pay | Admitting: Internal Medicine

## 2021-03-31 ENCOUNTER — Other Ambulatory Visit: Payer: Self-pay

## 2021-03-31 ENCOUNTER — Ambulatory Visit (AMBULATORY_SURGERY_CENTER): Payer: BC Managed Care – PPO | Admitting: Internal Medicine

## 2021-03-31 VITALS — BP 102/74 | HR 71 | Temp 97.1°F | Resp 13 | Ht 77.0 in | Wt 281.0 lb

## 2021-03-31 DIAGNOSIS — K449 Diaphragmatic hernia without obstruction or gangrene: Secondary | ICD-10-CM

## 2021-03-31 DIAGNOSIS — K219 Gastro-esophageal reflux disease without esophagitis: Secondary | ICD-10-CM

## 2021-03-31 DIAGNOSIS — K2970 Gastritis, unspecified, without bleeding: Secondary | ICD-10-CM

## 2021-03-31 DIAGNOSIS — R1310 Dysphagia, unspecified: Secondary | ICD-10-CM | POA: Diagnosis not present

## 2021-03-31 DIAGNOSIS — K317 Polyp of stomach and duodenum: Secondary | ICD-10-CM | POA: Diagnosis not present

## 2021-03-31 MED ORDER — SODIUM CHLORIDE 0.9 % IV SOLN: 500.0000 mL | Freq: Once | INTRAVENOUS | Status: DC

## 2021-03-31 NOTE — Progress Notes (Signed)
Called to room to assist during endoscopic procedure.  Patient ID and intended procedure confirmed with present staff. Received instructions for my participation in the procedure from the performing physician.  

## 2021-03-31 NOTE — Progress Notes (Signed)
0925 Robinul 0.1 mg IV given due large amount of secretions upon assessment.  MD made aware, vss   

## 2021-03-31 NOTE — Progress Notes (Signed)
Report given to PACU, vss 

## 2021-03-31 NOTE — Op Note (Signed)
[facility] Patient Name: Timothy Dixon Procedure Date: 03/31/2021 9:25 AM MRN: [id number] Endoscopist: Jerene Bears , MD Age: 61 Referring MD:  Date of Birth: [birth year] Gender: Male Account #: [id number] Procedure:                Upper GI endoscopy Indications:              Dysphagia (responsive to previous dilation),                            Gastro-esophageal reflux disease, remote Barrett's                            esophagus without dysplasia, history of gastric                            polyps Medicines:                Monitored Anesthesia Care Procedure:                Pre-Anesthesia Assessment:                           - Prior to the procedure, a History and Physical                            was performed, and patient medications and                            allergies were reviewed. The patient's tolerance of                            previous anesthesia was also reviewed. The risks                            and benefits of the procedure and the sedation                            options and risks were discussed with the patient.                            All questions were answered, and informed consent                            was obtained. Prior Anticoagulants: The patient has                            taken no previous anticoagulant or antiplatelet                            agents. ASA Grade Assessment: II - A patient with                            mild systemic disease. After reviewing the risks  and benefits, the patient was deemed in                            satisfactory condition to undergo the procedure.                           After obtaining informed consent, the endoscope was                            passed under direct vision. Throughout the                            procedure, the patient's blood pressure, pulse, and                            oxygen saturations were monitored continuously. The                             Endoscope was introduced through the mouth, and                            advanced to the second part of duodenum. The upper                            GI endoscopy was accomplished without difficulty.                            The patient tolerated the procedure well. Scope In: Scope Out: Findings:                 The Z-line was slightly irregular and was found 39                            cm from the incisors. No evidence of Barrett's                            esophagus or salmon-colored mucosa above the top of                            the gastric folds.                           No endoscopic abnormality was evident in the                            esophagus to explain the patient's complaint of                            dysphagia. It was decided, however, to proceed with                            dilation of the entire esophagus. The scope was  withdrawn. Dilation was performed with a Maloney                            dilator with mild resistance at 52 Fr.                           A small (1 to 2 cm) hiatal hernia was present.                           A single 10 mm semi-sessile polyp was found in the                            cardia. This polyp appears inflamed but was not                            actively bleeding. Biopsies were taken with a cold                            forceps for histology of probable inflamed                            hyperplastic polyp.                           Multiple 5 to 15 mm semi-sessile and sessile polyps                            with no bleeding were found in the gastric fundus                            and in the gastric body. Several of these polyps                            were mildly erythematous. Sampling biopsies were                            taken from multiple different polyps with a cold                            forceps for histology as representative sample.                            The examined duodenum was normal. Complications:            No immediate complications. Estimated Blood Loss:     Estimated blood loss was minimal. Impression:               - Z-line slightly irregular, 39 cm from the                            incisors. No evidence for Barrett's esophagus.                           - Esophagus dilated with 52 Fr.  Maloney.                           - Small hiatal hernia.                           - A single gastric cardia polyp. Biopsied.                           - Multiple gastric polyps in fundus and gastric                            body. Biopsied.                           - Normal examined duodenum. Recommendation:           - Patient has a contact number available for                            emergencies. The signs and symptoms of potential                            delayed complications were discussed with the                            patient. Return to normal activities tomorrow.                            Written discharge instructions were provided to the                            patient.                           - Resume previous diet as tolerated.                           - Continue present medications.                           - Await pathology results.                           - Discussion after pathology about possible                            resection of gastric polyps (depending on                            histology; there is no evidence of anemia). Jerene Bears, MD 03/31/2021 9:52:59 AM This report has been signed electronically.

## 2021-03-31 NOTE — Patient Instructions (Signed)
YOU HAD AN ENDOSCOPIC PROCEDURE TODAY AT THE [facility]:   Refer to the procedure report that was given to you for any specific questions about what was found during the examination.  If the procedure report does not answer your questions, please call your gastroenterologist to clarify.  If you requested that your care partner not be given the details of your procedure findings, then the procedure report has been included in a sealed envelope for you to review at your convenience later.  YOU SHOULD EXPECT: Some feelings of bloating in the abdomen. Passage of more gas than usual.  Walking can help get rid of the air that was put into your GI tract during the procedure and reduce the bloating. If you had a lower endoscopy (such as a colonoscopy or flexible sigmoidoscopy) you may notice spotting of blood in your stool or on the toilet paper. If you underwent a bowel prep for your procedure, you may not have a normal bowel movement for a few days.  Please Note:  You might notice some irritation and congestion in your nose or some drainage.  This is from the oxygen used during your procedure.  There is no need for concern and it should clear up in a day or so.  SYMPTOMS TO REPORT IMMEDIATELY:    Following upper endoscopy (EGD)  Vomiting of blood or coffee ground material  New chest pain or pain under the shoulder blades  Painful or persistently difficult swallowing  New shortness of breath  Fever of 100F or higher  Black, tarry-looking stools  For urgent or emergent issues, a gastroenterologist can be reached at any hour by calling [phone]. Do not use MyChart messaging for urgent concerns.    DIET:  We do recommend a small meal at first, but then you may proceed to your regular diet.  Drink plenty of fluids but you should avoid alcoholic beverages for 24 hours.  ACTIVITY:  You should plan to take it easy for the rest of today and you should NOT DRIVE or use heavy machinery  until tomorrow (because of the sedation medicines used during the test).    FOLLOW UP: Our staff will call the number listed on your records 48-72 hours following your procedure to check on you and address any questions or concerns that you may have regarding the information given to you following your procedure. If we do not reach you, we will leave a message.  We will attempt to reach you two times.  During this call, we will ask if you have developed any symptoms of COVID 19. If you develop any symptoms (ie: fever, flu-like symptoms, shortness of breath, cough etc.) before then, please call [phone].  If you test positive for Covid 19 in the 2 weeks post procedure, please call and report this information to us.    If any biopsies were taken you will be contacted by phone or by letter within the next 1-3 weeks.  Please call us at [phone] if you have not heard about the biopsies in 3 weeks.    SIGNATURES/CONFIDENTIALITY: You and/or your care partner have signed paperwork which will be entered into your electronic medical record.  These signatures attest to the fact that that the information above on your After Visit Summary has been reviewed and is understood.  Full responsibility of the confidentiality of this discharge information lies with you and/or your care-partner. 

## 2021-03-31 NOTE — Progress Notes (Signed)
See office note dated 03/26/2021 for details. Patient here for EGD today No significant change in medical history or symptoms since office visit.  He remains appropriate for EGD in the [facility] today.

## 2021-03-31 NOTE — Progress Notes (Signed)
Pt's states no medical or surgical changes since previsit or office visit.  ° °VS DT °

## 2021-04-02 ENCOUNTER — Telehealth: Payer: Self-pay | Admitting: *Deleted

## 2021-04-02 NOTE — Telephone Encounter (Signed)
NO answer for post procedure call back. Left VM. 

## 2021-04-02 NOTE — Telephone Encounter (Signed)
Left message on f/u call 

## 2021-04-10 ENCOUNTER — Encounter: Payer: Self-pay | Admitting: Internal Medicine

## 2021-04-16 ENCOUNTER — Telehealth: Payer: Self-pay | Admitting: Internal Medicine

## 2021-04-16 NOTE — Telephone Encounter (Signed)
Inbound call from patient requesting a call back with EGD results. States he never received a call or letter. Best contact number [phone] ?

## 2021-04-16 NOTE — Telephone Encounter (Signed)
Reviewed path letter with pt over the phone, questions were answered. ?

## 2021-05-21 ENCOUNTER — Telehealth: Payer: Self-pay | Admitting: Internal Medicine

## 2021-05-21 MED ORDER — RABEPRAZOLE SODIUM 20 MG PO TBEC: 20.0000 mg | DELAYED_RELEASE_TABLET | Freq: Two times a day (BID) | ORAL | 1 refills | Status: DC

## 2021-05-21 NOTE — Telephone Encounter (Signed)
Patient called requesting a refill for his rabeprazole.  He has only 1 pill left and would like it called into [facility] in [place] on [address] Thank you. ?

## 2021-05-21 NOTE — Telephone Encounter (Signed)
Refills have been sent.  

## 2021-06-25 ENCOUNTER — Ambulatory Visit (INDEPENDENT_AMBULATORY_CARE_PROVIDER_SITE_OTHER): Payer: BC Managed Care – PPO | Admitting: Cardiology

## 2021-06-25 ENCOUNTER — Encounter: Payer: Self-pay | Admitting: Cardiology

## 2021-06-25 VITALS — BP 138/72 | HR 92 | Ht 77.0 in | Wt 288.0 lb

## 2021-06-25 DIAGNOSIS — R072 Precordial pain: Secondary | ICD-10-CM

## 2021-06-25 DIAGNOSIS — R0789 Other chest pain: Secondary | ICD-10-CM

## 2021-06-25 DIAGNOSIS — G4733 Obstructive sleep apnea (adult) (pediatric): Secondary | ICD-10-CM | POA: Diagnosis not present

## 2021-06-25 MED ORDER — METOPROLOL TARTRATE 100 MG PO TABS: 100.0000 mg | ORAL_TABLET | Freq: Once | ORAL | 0 refills | Status: DC

## 2021-06-25 NOTE — Addendum Note (Signed)
Addended by: Antonieta Iba on: 06/25/2021 03:24 PM ? ? Modules accepted: Orders ? ?

## 2021-06-25 NOTE — Progress Notes (Signed)
? ?Cardiology CONSULT Note   ? ?Date:  06/25/2021  ? ?ID:  Timothy Dixon, DOB [birth year], MRN [id number] ? ?PCP:  London Pepper, MD  ?Cardiologist:  Fransico Him, MD  ? ?Chief Complaint  ?Patient presents with  ? Chest Pain  ? Sleep Apnea  ? ? ?History of Present Illness:  ?Timothy Dixon is a 61 y.o. male who is being seen today for the evaluation of chest pain at the request of London Pepper, MD. ? ?This is a 61 year old male with a history of Barrett esophagus, GERD, gastric polyp, fatty liver, DVT after surgery for plantar faciitis, hyperlipidemia and obstructive sleep apnea who was referred for evaluation of chest pain.  He was seen back in 2015 for his CPAP but was lost to follow-up.  At that time he complained of some atypical chest pain but had a normal cath several years prior.  He underwent exercise treadmill test in 2015 that was normal. ? ?He is doing well with his CPAP device and thinks that he has gotten used to it.  He tolerates the mask and feels the pressure is adequate.  Since going on CPAP he feels rested in the am and has no significant daytime sleepiness.  He denies any significant mouth or nasal dryness or nasal congestion.  He does not think that he snores.    ? 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  Recently he was sitting on the couch and had sudden onset of mid sternal sharp chest pain to the point he doubled over.  There was no associated diaphoresis or nausea but could not take a deep breath in.  There was no radiation of the pain and it lasted about 30-45 seconds.  Then after that he developed chest tightness which lasted for about 24 hours later.  He has had some intermittent tightness in his chest since then lasting 30-60 min.  He has not had any DOE recently.  He says that this pain is not his typical GERD pain.   ? ?Past Medical History:  ?Diagnosis Date  ? Anxiety   ? Barrett esophagus   ? DVT (deep venous  thrombosis) ([place])   ? Elevated LFTs   ? Esophageal reflux   ? Fatty liver   ? Gallbladder polyp   ? Gastric polyp 2020  ? hyperplastic  ? GERD (gastroesophageal reflux disease)   ? Hepatic steatosis   ? Hiatal hernia   ? Hypercholesterolemia   ? OSA (obstructive sleep apnea)   ? Sleep apnea   ? ? ?Past Surgical History:  ?Procedure Laterality Date  ? CARDIAC CATHETERIZATION  2010  ? Normal coronary arteries by cath  ? COLONOSCOPY    ? ESOPHAGEAL MANOMETRY N/A 09/26/2012  ? Procedure: ESOPHAGEAL MANOMETRY (EM);  Surgeon: Sable Feil, MD;  Location: [facility];  Service: Endoscopy;  Laterality: N/A;  ? UPPER GASTROINTESTINAL ENDOSCOPY    ? ? ?Current Medications: ?Current Meds  ?Medication Sig  ? atorvastatin (LIPITOR) 20 MG tablet Take 20 mg by mouth daily at 6 PM.   ? metFORMIN (GLUCOPHAGE) 1000 MG tablet Take 1,000 mg by mouth 2 (two) times daily.  ? RABEprazole (ACIPHEX) 20 MG tablet Take 1 tablet (20 mg total) by mouth 2 (two) times daily.  ? vitamin B-12 (CYANOCOBALAMIN) 1000 MCG tablet Take 1,000 mcg by mouth daily.  ? vitamin E 400 UNIT capsule Take 800 Units by mouth daily. Two capsules daily  ?  XIIDRA 5 % SOLN   ? ? ?Allergies:   Patient has no known allergies.  ? ?Social History  ? ?Socioeconomic History  ? Marital status: Married  ?  Spouse name: Not on file  ? Number of children: Not on file  ? Years of education: Not on file  ? Highest education level: Not on file  ?Occupational History  ? Occupation: Architectural technologist  ?  Employer: UNEMPLOYED  ?Tobacco Use  ? Smoking status: Never  ? Smokeless tobacco: Never  ?Vaping Use  ? Vaping Use: Never used  ?Substance and Sexual Activity  ? Alcohol use: Yes  ?  Comment: rare beer  ? Drug use: No  ? Sexual activity: Not on file  ?Other Topics Concern  ? Not on file  ?Social History Narrative  ? Not on file  ? ?Social Determinants of Health  ? ?Financial Resource Strain: Not on file  ?Food Insecurity: Not on file  ?Transportation Needs: Not on file   ?Physical Activity: Not on file  ?Stress: Not on file  ?Social Connections: Not on file  ?  ? ?Family History:  The patient's family history includes Diabetes in his father and mother; Heart disease in his paternal grandfather and another family member; Hypertension in his brother.  ? ?ROS:   ?Please see the history of present illness.    ?ROS All other systems reviewed and are negative. ? ?   ? View : No data to display.  ?  ?  ?  ? ? ? ?PHYSICAL EXAM:   ?VS:  BP 138/72   Pulse 92   Ht '6\' 5"'$  (1.956 m)   Wt 288 lb (130.6 kg)   SpO2 96%   BMI 34.15 kg/m?    ?GEN: Well nourished, well developed, in no acute distress  ?HEENT: normal  ?Neck: no JVD, carotid bruits, or masses ?Cardiac: RRR; no murmurs, rubs, or gallops,no edema.  Intact distal pulses bilaterally.  ?Respiratory:  clear to auscultation bilaterally, normal work of breathing ?GI: soft, nontender, nondistended, + BS ?MS: no deformity or atrophy  ?Skin: warm and dry, no rash ?Neuro:  Alert and Oriented x 3, Strength and sensation are intact ?Psych: euthymic mood, full affect ? ?Wt Readings from Last 3 Encounters:  ?06/25/21 288 lb (130.6 kg)  ?03/31/21 281 lb (127.5 kg)  ?03/26/21 281 lb (127.5 kg)  ?  ? ? ?Studies/Labs Reviewed:  ? ?EKG:  EKG is not ordered today.  The ekg ordered at PCP on 05/21/2021  demonstrates NSR with no ST changes ? ?Recent Labs: ?03/26/2021: ALT 54; BUN 21; Creatinine, Ser 1.10; Hemoglobin 14.7; Platelets 153.0; Potassium 4.4; Sodium 139  ? ?Lipid Panel ?   ?Component Value Date/Time  ? CHOL (H) 03/23/2007 0345  ?  283        ?ATP III CLASSIFICATION: ? <200     mg/dL   Desirable ? 200-239  mg/dL   Borderline High ? >=240    mg/dL   High  ? TRIG 279 (H) 03/23/2007 0345  ? HDL 31 (L) 03/23/2007 0345  ? CHOLHDL 9.1 03/23/2007 0345  ? VLDL 56 (H) 03/23/2007 0345  ? [place] (H) 03/23/2007 0345  ?  196        ?Total Cholesterol/HDL:CHD Risk ?Coronary Heart Disease Risk Table ?                    Men   Women ? 1/2 Average Risk   3.4   3.3   ? ? ?  Additional studies/ records that were reviewed today include:  ?Prior ETT in 2015 ? ? ? ?ASSESSMENT:   ? ?1. Chest pain, atypical   ?2. OSA (obstructive sleep apnea)   ? ? ? ?PLAN:  ?In order of problems listed above: ? ?Chest pain ?-symptoms are somewhat atypical in that it was initially sharp and then became a tightness and occurred at rest with no associated sx or radiation ?-I will get a coronary CTA to define coronary anatomy and rule out CAD ? ?2.  OSA - The patient is tolerating PAP therapy well without any problems. The PAP download performed by his DME was personally reviewed and interpreted by me today and showed an AHI of 1/hr on auto CPAP 8-16 cm H2O with 100% compliance in using more than 4 hours nightly.  The patient has been using and benefiting from PAP use and will continue to benefit from therapy.  ?  ? ?Time Spent: ?20 minutes total time of encounter, including 15 minutes spent in face-to-face patient care on the date of this encounter. This time includes coordination of care and counseling regarding above mentioned problem list. Remainder of non-face-to-face time involved reviewing chart documents/testing relevant to the patient encounter and documentation in the medical record. I have independently reviewed documentation from referring provider ? ?Medication Adjustments/Labs and Tests Ordered: ?Current medicines are reviewed at length with the patient today.  Concerns regarding medicines are outlined above.  Medication changes, Labs and Tests ordered today are listed in the Patient Instructions below. ? ?There are no Patient Instructions on file for this visit. ? ? ?Signed, ?Fransico Him, MD  ?06/25/2021 3:09 PM    ?[facility] ?[address], [place], [place]  [address] ?Phone: [phone]; Fax: [phone]  ? ?

## 2021-06-25 NOTE — Patient Instructions (Addendum)
Medication Instructions:  ?Your physician recommends that you continue on your current medications as directed. Please refer to the Current Medication list given to you today. ? ?*If you need a refill on your cardiac medications before your next appointment, please call your pharmacy* ? ?Testing/Procedures: ?Your provider has recommended that you have a coronary CTA scan. Please see next page for instrcutions.  ? ?Follow-Up: ?At [facility], you and your health needs are our priority.  As part of our continuing mission to provide you with exceptional heart care, we have created designated Provider Care Teams.  These Care Teams include your primary Cardiologist (physician) and Advanced Practice Providers (APPs -  Physician Assistants and Nurse Practitioners) who all work together to provide you with the care you need, when you need it. ? ?Your next appointment:   ?1 year(s) ? ?The format for your next appointment:   ?In Person ? ?Provider:   ?Fransico Him, MD   ? ? ?Other Instructions ? ? ?Your cardiac CT will be scheduled at:  ? ?[facility] ?[address] ?[place], [place] [address] ?[phone] ? ?If scheduled at [facility], please arrive at the [facility] and Children's Entrance (Entrance C2) of [facility] 30 minutes prior to test start time. ?You can use the FREE valet parking offered at entrance C (encouraged to control the heart rate for the test)  ?Proceed to the [facility] Radiology Department (first floor) to check-in and test prep. ? ?All radiology patients and guests should use entrance C2 at [facility], accessed from [facility], even though the hospital's physical address listed is [address]. ? ? ? ?Please follow these instructions carefully (unless otherwise directed): ? ?Hold all erectile dysfunction medications at least 3 days (72 hrs) prior to test. ? ?On the Night Before the Test: ?Be sure to Drink plenty of water. ?Do not consume  any caffeinated/decaffeinated beverages or chocolate 12 hours prior to your test. ?Do not take any antihistamines 12 hours prior to your test. ? ?On the Day of the Test: ?Drink plenty of water until 1 hour prior to the test. ?Do not eat any food 4 hours prior to the test. ?You may take your regular medications prior to the test.  ?Take metoprolol (Lopressor) two hours prior to test. ? ?After the Test: ?Drink plenty of water. ?After receiving IV contrast, you may experience a mild flushed feeling. This is normal. ?On occasion, you may experience a mild rash up to 24 hours after the test. This is not dangerous. If this occurs, you can take Benadryl 25 mg and increase your fluid intake. ?If you experience trouble breathing, this can be serious. If it is severe call 911 IMMEDIATELY. If it is mild, please call our office. ?If you take any of these medications: Glipizide/Metformin, Avandament, Glucavance, please do not take 48 hours after completing test unless otherwise instructed. ? ?We will call to schedule your test 2-4 weeks out understanding that some insurance companies will need an authorization prior to the service being performed.  ? ?For non-scheduling related questions, please contact the cardiac imaging nurse navigator should you have any questions/concerns: ?Marchia Bond, Cardiac Imaging Nurse Navigator ?Gordy Clement, Cardiac Imaging Nurse Navigator ?[facility] ?Direct Office Dial: [phone]  ? ?For scheduling needs, including cancellations and rescheduling, please call [place], [phone].  ? ?Important Information About Sugar ? ? ? ? ?  ?

## 2021-07-11 ENCOUNTER — Telehealth (HOSPITAL_COMMUNITY): Payer: Self-pay | Admitting: *Deleted

## 2021-07-11 NOTE — Telephone Encounter (Signed)
Reaching out to patient to offer assistance regarding upcoming cardiac imaging study; pt verbalizes understanding of appt date/time, parking situation and where to check in, pre-test NPO status and medications ordered, and verified current allergies; name and call back number provided for further questions should they arise  Timothy Clement RN Navigator Cardiac Imaging [facility] [phone] office [phone] cell  Patient to take '100mg'$  metoprolol tartrate two hours prior to his cardiac CT scan.  He is aware to arrive at 7:45am.

## 2021-07-16 ENCOUNTER — Ambulatory Visit (HOSPITAL_BASED_OUTPATIENT_CLINIC_OR_DEPARTMENT_OTHER)
Admission: RE | Admit: 2021-07-16 | Discharge: 2021-07-16 | Disposition: A | Discharge status: Home / Self Care | Payer: BC Managed Care – PPO | Source: Ambulatory Visit | Attending: Internal Medicine | Admitting: Internal Medicine

## 2021-07-16 ENCOUNTER — Ambulatory Visit (HOSPITAL_COMMUNITY)
Admission: RE | Admit: 2021-07-16 | Discharge: 2021-07-16 | Disposition: A | Discharge status: Home / Self Care | Payer: BC Managed Care – PPO | Source: Ambulatory Visit | Attending: Internal Medicine | Admitting: Internal Medicine

## 2021-07-16 ENCOUNTER — Encounter: Payer: Self-pay | Admitting: Cardiology

## 2021-07-16 ENCOUNTER — Other Ambulatory Visit: Payer: Self-pay | Admitting: Internal Medicine

## 2021-07-16 ENCOUNTER — Telehealth: Payer: Self-pay | Admitting: Cardiology

## 2021-07-16 ENCOUNTER — Ambulatory Visit (HOSPITAL_COMMUNITY)
Admission: RE | Admit: 2021-07-16 | Discharge: 2021-07-16 | Disposition: A | Discharge status: Home / Self Care | Payer: BC Managed Care – PPO | Source: Ambulatory Visit | Attending: Cardiology | Admitting: Cardiology

## 2021-07-16 DIAGNOSIS — R931 Abnormal findings on diagnostic imaging of heart and coronary circulation: Secondary | ICD-10-CM

## 2021-07-16 DIAGNOSIS — R072 Precordial pain: Secondary | ICD-10-CM | POA: Diagnosis present

## 2021-07-16 DIAGNOSIS — R0789 Other chest pain: Secondary | ICD-10-CM

## 2021-07-16 DIAGNOSIS — I2510 Atherosclerotic heart disease of native coronary artery without angina pectoris: Secondary | ICD-10-CM | POA: Diagnosis not present

## 2021-07-16 LAB — POCT I-STAT CREATININE: Creatinine, Ser: 1.1 mg/dL (ref 0.61–1.24)

## 2021-07-16 MED ORDER — NITROGLYCERIN 0.4 MG SL SUBL
0.8000 mg | SUBLINGUAL_TABLET | Freq: Once | SUBLINGUAL | Status: AC
  Administered 2021-07-16: 0.8 mg via SUBLINGUAL

## 2021-07-16 MED ORDER — IOHEXOL 350 MG/ML SOLN
100.0000 mL | Freq: Once | INTRAVENOUS | Status: AC | PRN
  Administered 2021-07-16: 100 mL via INTRAVENOUS

## 2021-07-16 MED ORDER — ASPIRIN 81 MG PO TBEC: 81.0000 mg | DELAYED_RELEASE_TABLET | Freq: Every day | ORAL | 3 refills | Status: DC

## 2021-07-16 MED ORDER — NITROGLYCERIN 0.4 MG SL SUBL
SUBLINGUAL_TABLET | SUBLINGUAL | Status: AC
  Filled 2021-07-16: qty 2

## 2021-07-16 MED ORDER — METOPROLOL SUCCINATE ER 25 MG PO TB24: 25.0000 mg | ORAL_TABLET | Freq: Every day | ORAL | 3 refills | Status: DC

## 2021-07-16 NOTE — Telephone Encounter (Signed)
Timothy Margarita, MD  07/16/2021 12:25 PM EDT     Please also get a 2D echo to evaluate pulmonary artery/   Timothy Margarita, MD  07/16/2021 12:25 PM EDT     FFR showed no flow limitation in the proximal RCA or diagonal branch there was some intermediate reduction in flow in the distal LAD likely secondary to vessel tapering.  Agree with medical management

## 2021-07-16 NOTE — Telephone Encounter (Signed)
Patient was returning call for results. Please advise.  Patient ask if you dont get him to please leave info on his voicemail

## 2021-07-16 NOTE — Telephone Encounter (Signed)
-----   Message from Sueanne Margarita, MD sent at 07/16/2021 12:23 PM EDT ----- Coronary CTA showed coronary calcium score of 121 with 50 to 69% mixed plaque in the proximal RCA, 25 to 49% mixed plaque in the mid and distal LAD, moderate 50 to 69% mixed plaque in the first diagonal, mild ostial 25 to 50% stenosis of the ramus intermedius and mildly calcified aortic valve.  Pulmonary artery was mildly dilated as well 31 mm.  Await FFR results.  Please have patient come in for a fasting lipid panel and ALT.start aspirin 81 mg daily.  Start Toprol-XL 25 mg daily and follow-up in 2 to 3 weeks with Melina Copa, PA

## 2021-07-16 NOTE — Telephone Encounter (Signed)
The patient has been notified of the result and verbalized understanding.  All questions (if any) were answered. Antonieta Iba, RN 07/16/2021 2:07 PM   RX has been sent in. Labs have been scheduled. Follow up appointment has been scheduled.

## 2021-08-01 ENCOUNTER — Telehealth: Payer: Self-pay | Admitting: Cardiology

## 2021-08-01 NOTE — Telephone Encounter (Signed)
Spoke with pt who is having his wellness check including blood work at PCP next week.  Pt aware the lab work Dr Radford Pax is ordering is a lipid panel and ALT which will be included in the blood work his PCP is already drawing.  Pt to have PCP office to fax the results to Dr Radford Pax once completed.  Pt was given the fax #.

## 2021-08-01 NOTE — Telephone Encounter (Signed)
Patient is having his well check two days prior to his visit with [place]. He would like to have his labs done at Dr. Darien Ramus office.  He would like the lab orders Dr. Radford Pax wants done faxed over to [facility], their fax # is [phone].  He said to put his name and DOB on the fax.

## 2021-08-08 ENCOUNTER — Ambulatory Visit (INDEPENDENT_AMBULATORY_CARE_PROVIDER_SITE_OTHER): Payer: BC Managed Care – PPO | Admitting: Physician Assistant

## 2021-08-08 ENCOUNTER — Encounter: Payer: Self-pay | Admitting: Physician Assistant

## 2021-08-08 ENCOUNTER — Other Ambulatory Visit: Payer: BC Managed Care – PPO

## 2021-08-08 VITALS — BP 120/70 | HR 80 | Ht 77.0 in | Wt 286.6 lb

## 2021-08-08 DIAGNOSIS — E785 Hyperlipidemia, unspecified: Secondary | ICD-10-CM | POA: Diagnosis not present

## 2021-08-08 DIAGNOSIS — E669 Obesity, unspecified: Secondary | ICD-10-CM

## 2021-08-08 DIAGNOSIS — G4733 Obstructive sleep apnea (adult) (pediatric): Secondary | ICD-10-CM | POA: Diagnosis not present

## 2021-08-08 DIAGNOSIS — I2510 Atherosclerotic heart disease of native coronary artery without angina pectoris: Secondary | ICD-10-CM | POA: Diagnosis not present

## 2021-08-08 DIAGNOSIS — I10 Essential (primary) hypertension: Secondary | ICD-10-CM

## 2021-08-08 MED ORDER — NITROGLYCERIN 0.4 MG SL SUBL: 0.4000 mg | SUBLINGUAL_TABLET | SUBLINGUAL | 3 refills | Status: DC | PRN

## 2021-10-27 ENCOUNTER — Telehealth: Payer: Self-pay | Admitting: Cardiology

## 2021-10-27 MED ORDER — NEBIVOLOL HCL 2.5 MG PO TABS: 2.5000 mg | ORAL_TABLET | Freq: Every day | ORAL | 3 refills | Status: DC

## 2021-10-27 NOTE — Telephone Encounter (Signed)
Pt called back regarding questions with recent weight loss.    Pt states lost 40 lbs, new issue is Fatigue.  He is tired all the time, hard to be productive without high energy.  Pt wondered if dose of Metoprolol succinate 25 mg Daily was contributing to this?  Pt states his BGL has been in the 90's / well controlled.   Pt sees Dr Orland Mustard, PCP on 9/22 for office visit and labs.  Pt just wants guidance on how to address his new onset fatigue with WT loss.   Will forward question to Dr. Radford Pax and RN, to obtain new POC s/p weight loss.  Follow up required.

## 2021-10-27 NOTE — Telephone Encounter (Signed)
Change Toprol to Bystolic 2.'5mg'$  daily and let us know if sx improve   New order per Dr. Fransico Him  Pt called back and told he should stop taking his metoprolol succinate 25 mg tablet, and begin taking Bystolic 2.5 mg daily per Dr. Fransico Him.  Verified pt pharmacy.  Pt told to call us back in 3-4 days after starting new Bystolic if his symptom of Fatigue does NOT improve.    Pt understood all instructions, and MyChart msg also sent to confirm instructions.  No follow up required at this time.

## 2021-10-27 NOTE — Telephone Encounter (Signed)
Patient returned RN's call, but call became disconnected in the process of trying to reach the nurse. Attempted to call patient back, but received his VM. Patient has lost 40 lbs and is wanting to know if medication dosage is too high due to having fatigue. Please advise.

## 2021-10-27 NOTE — Telephone Encounter (Signed)
Left message for patient to call us back.  Did not understand lunch 40 lbs, unsure if he is talking about weight gain vs lack of energy / meds hes is currently taking daily per note.  Called to obtain more information before reaching out to Dr. Radford Pax team. Follow up required.

## 2021-10-27 NOTE — Telephone Encounter (Signed)
Patient states that he has lunch 40lbs but doesn't have any energy. Patient is unsure if the medication is too strong for him now. Please advise

## 2021-11-09 ENCOUNTER — Other Ambulatory Visit: Payer: Self-pay | Admitting: Internal Medicine

## 2022-01-28 NOTE — Progress Notes (Signed)
Cardiology  Note    Date:  01/29/2022   ID:  Timothy, Dixon 10-19-1960, MRN [id number]  PCP:  Farris Has, MD  Cardiologist:  Armanda Magic, MD   Chief Complaint  Patient presents with   Coronary Artery Disease   Sleep Apnea   Hyperlipidemia   Hypertension    History of Present Illness:  Timothy Dixon is a 61 y.o. male with a history of Barrett esophagus, GERD, gastric polyp, fatty liver, DVT after surgery for plantar faciitis, hyperlipidemia and obstructive sleep apnea who was referred for evaluation of chest pain.  He was seen back in 2015 for his CPAP but was lost to follow-up.  At that time he complained of some atypical chest pain but had a normal cath several years prior.  He underwent exercise treadmill test in 2015 that was normal.  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 He saw me back in 06/2021 with CP.  His symptoms are somewhat atypical in that it was initially sharp and then became a tightness and occurred at rest with no associated sx or radiation. Coronary CTA showed Moderate CAD in the proximal RCA and proximal first diagonal artery (50-69%). Coronary calcium score is 121, which places the patient in the 75th percentile for age and sex matched control. CT FFR analysis showed no significant stenosis in the proximal RCA or first diagonal branch. Indeterminate FFR in distal LAD likely secondary to vessel tapering. Recommended medical therapy.   He is here today for followup and is doing well.  He denies any chest pain or pressure, SOB, DOE, PND, orthopnea, LE edema, dizziness, palpitations or syncope. He is compliant with his meds and is tolerating meds with no SE.  He is getting ready to go back to the gym.   He is doing well with his PAP device and thinks that he has gotten used to it.  He tolerates the mask and feels the pressure is adequate.  Since going on PAP he feels rested in the am and has no significant daytime  sleepiness.  He denies any significant mouth or nasal dryness or nasal congestion.  He does not think that he snores.     Past Medical History:  Diagnosis Date   Anxiety    Barrett esophagus    CAD (coronary artery disease), native coronary artery    Coronary CTA 06/2021 showed Coronary CTA showed coronary calcium score of 121 with 50 to 69% mixed plaque in the proximal RCA, 25 to 49% mixed plaque in the mid and distal LAD, moderate 50 to 69% mixed plaque in the first diagonal, mild ostial 25 to 50% stenosis of the ramus intermedius and mildly calcified aortic valve.   Diabetes mellitus type 2, controlled (HCC)    DVT (deep venous thrombosis) (HCC)    Elevated LFTs    Esophageal reflux    Fatty liver    Gallbladder polyp    Gastric polyp 2020   hyperplastic   GERD (gastroesophageal reflux disease)    Hepatic steatosis    Hiatal hernia    Hypercholesterolemia    OSA (obstructive sleep apnea)    Sleep apnea     Past Surgical History:  Procedure Laterality Date   CARDIAC CATHETERIZATION  2010   Normal coronary arteries by cath   COLONOSCOPY     ESOPHAGEAL MANOMETRY N/A 09/26/2012   Procedure: ESOPHAGEAL MANOMETRY (EM);  Surgeon: Mardella Layman, MD;  Location: [facility];  Service: Endoscopy;  Laterality: N/A;   UPPER GASTROINTESTINAL ENDOSCOPY      Current Medications: Current Meds  Medication Sig   aspirin EC 81 MG tablet Take 1 tablet (81 mg total) by mouth daily. Swallow whole.   atorvastatin (LIPITOR) 20 MG tablet Take 20 mg by mouth daily at 6 PM.    metFORMIN (GLUCOPHAGE) 1000 MG tablet Take 1,000 mg by mouth 2 (two) times daily.   nitroGLYCERIN (NITROSTAT) 0.4 MG SL tablet Place 1 tablet (0.4 mg total) under the tongue every 5 (five) minutes as needed for chest pain. one tab every 5 minutes up to 3 tablets total over 15 minutes.  If no relief CALL 911.   RABEprazole (ACIPHEX) 20 MG tablet Take 1 tablet by mouth twice daily   vitamin B-12 (CYANOCOBALAMIN) 1000 MCG  tablet Take 1,000 mcg by mouth daily.   vitamin E 400 UNIT capsule Take 800 Units by mouth daily. Two capsules daily   [DISCONTINUED] nebivolol (BYSTOLIC) 2.5 MG tablet Take 1 tablet (2.5 mg total) by mouth daily.    Allergies:   Patient has no known allergies.   Social History   Socioeconomic History   Marital status: Married    Spouse name: Not on file   Number of children: Not on file   Years of education: Not on file   Highest education level: Not on file  Occupational History   Occupation: Tree surgeon: UNEMPLOYED  Tobacco Use   Smoking status: Never   Smokeless tobacco: Never  Vaping Use   Vaping Use: Never used  Substance and Sexual Activity   Alcohol use: Yes    Comment: rare beer   Drug use: No   Sexual activity: Not on file  Other Topics Concern   Not on file  Social History Narrative   Not on file   Social Determinants of Health   Financial Resource Strain: Not on file  Food Insecurity: Not on file  Transportation Needs: Not on file  Physical Activity: Not on file  Stress: Not on file  Social Connections: Not on file     Family History:  The patient's family history includes Diabetes in his father and mother; Heart disease in his paternal grandfather and another family member; Hypertension in his brother.   ROS:   Please see the history of present illness.    ROS All other systems reviewed and are negative.      No data to display           PHYSICAL EXAM:   VS:  BP 112/78   Pulse 78   Ht 6\' 5"  (1.956 m)   Wt 243 lb 9.6 oz (110.5 kg)   SpO2 97%   BMI 28.89 kg/m    GEN: Well nourished, well developed in no acute distress HEENT: Normal NECK: No JVD; No carotid bruits LYMPHATICS: No lymphadenopathy CARDIAC:RRR, no murmurs, rubs, gallops RESPIRATORY:  Clear to auscultation without rales, wheezing or rhonchi  ABDOMEN: Soft, non-tender, non-distended MUSCULOSKELETAL:  No edema; No deformity  SKIN: Warm and  dry NEUROLOGIC:  Alert and oriented x 3 PSYCHIATRIC:  Normal affect  Wt Readings from Last 3 Encounters:  01/29/22 243 lb 9.6 oz (110.5 kg)  08/08/21 286 lb 9.6 oz (130 kg)  06/25/21 288 lb (130.6 kg)      Studies/Labs Reviewed:   EKG:  EKG is not ordered today.    Recent Labs: 03/26/2021: ALT 54; BUN 21; Hemoglobin 14.7; Platelets 153.0; Potassium 4.4;  Sodium 139 07/16/2021: Creatinine, Ser 1.10   Lipid Panel    Component Value Date/Time   CHOL (H) 03/23/2007 0345    283        ATP III CLASSIFICATION:  <200     mg/dL   Desirable  161-096  mg/dL   Borderline High  >=045    mg/dL   High   TRIG 409 (H) 81/19/1478 0345   HDL 31 (L) 03/23/2007 0345   CHOLHDL 9.1 03/23/2007 0345   VLDL 56 (H) 03/23/2007 0345   LDLCALC (H) 03/23/2007 0345    196        Total Cholesterol/HDL:CHD Risk Coronary Heart Disease Risk Table                     Men   Women  1/2 Average Risk   3.4   3.3    Additional studies/ records that were reviewed today include:  Prior ETT in 2015    ASSESSMENT:    1. Coronary artery disease involving native coronary artery of native heart without angina pectoris   2. OSA (obstructive sleep apnea)   3. Hyperlipidemia LDL goal <70   4. Essential hypertension      PLAN:  In order of problems listed above:  CAD -Coronary CTA showed 50-69% pD1 and pRCA with coronary Ca score 121 and CT FFR analysis showed no significant stenosis in the proximal RCA or first diagonal branch. Indeterminate FFR in distal LAD likely secondary to vessel tapering -he has not had any further CP -continue prescription drug management with ASA 81mg  daily, Atorvastatin, Bystolic 2.5mg  daily with PRN refills  2.  OSA - The patient is tolerating PAP therapy well without any problems. The PAP download performed by his DME was personally reviewed and interpreted by me today and showed an AHI of 0.4/hr on auto CPAP from 8 to 16 cm H2O with 97% compliance in using more than 4 hours  nightly.  The patient has been using and benefiting from PAP use and will continue to benefit from therapy.   3.  HLD -LDL goal < 70 -I have personally reviewed and interpreted outside labs performed by patient's PCP which showed LDL 44, HDL 32 on 11/07/2021 -continue prescription drug management with Atorvastatin 20mg  daiy with PRN refills  4.  HTN -BP controlled on exam today -continue Bystolic 2.5mg  daily with PRN refills    Medication Adjustments/Labs and Tests Ordered: Current medicines are reviewed at length with the patient today.  Concerns regarding medicines are outlined above.  Medication changes, Labs and Tests ordered today are listed in the Patient Instructions below.  There are no Patient Instructions on file for this visit.   Signed, Armanda Magic, MD  01/29/2022 8:16 AM    [facility] [address] [place], [place], [place]  [address] Phone: [phone]; Fax: [phone]

## 2022-01-29 ENCOUNTER — Ambulatory Visit: Payer: BC Managed Care – PPO | Attending: Cardiology | Admitting: Cardiology

## 2022-01-29 ENCOUNTER — Encounter: Payer: Self-pay | Admitting: Cardiology

## 2022-01-29 VITALS — BP 112/78 | HR 78 | Ht 77.0 in | Wt 243.6 lb

## 2022-01-29 DIAGNOSIS — I10 Essential (primary) hypertension: Secondary | ICD-10-CM | POA: Diagnosis not present

## 2022-01-29 DIAGNOSIS — I2510 Atherosclerotic heart disease of native coronary artery without angina pectoris: Secondary | ICD-10-CM | POA: Diagnosis not present

## 2022-01-29 DIAGNOSIS — E785 Hyperlipidemia, unspecified: Secondary | ICD-10-CM | POA: Diagnosis not present

## 2022-01-29 DIAGNOSIS — G4733 Obstructive sleep apnea (adult) (pediatric): Secondary | ICD-10-CM

## 2022-01-29 MED ORDER — NEBIVOLOL HCL 2.5 MG PO TABS: 2.5000 mg | ORAL_TABLET | Freq: Every day | ORAL | 3 refills | Status: DC

## 2022-01-29 NOTE — Patient Instructions (Signed)
Medication Instructions:  Your doctor has refilled your bystolic prescription.  *If you need a refill on your cardiac medications before your next appointment, please call your pharmacy*   Lab Work: None. If you have labs (blood work) drawn today and your tests are completely normal, you will receive your results only by: [place] (if you have MyChart) OR A paper copy in the mail If you have any lab test that is abnormal or we need to change your treatment, we will call you to review the results.   Testing/Procedures: None.   Follow-Up: At [facility], you and your health needs are our priority.  As part of our continuing mission to provide you with exceptional heart care, we have created designated Provider Care Teams.  These Care Teams include your primary Cardiologist (physician) and Advanced Practice Providers (APPs -  Physician Assistants and Nurse Practitioners) who all work together to provide you with the care you need, when you need it.  We recommend signing up for the patient portal called "MyChart".  Sign up information is provided on this After Visit Summary.  MyChart is used to connect with patients for Virtual Visits (Telemedicine).  Patients are able to view lab/test results, encounter notes, upcoming appointments, etc.  Non-urgent messages can be sent to your provider as well.   To learn more about what you can do with MyChart, go to [email or web address].    Your next appointment:   1 year(s)  The format for your next appointment:   In Person  Provider:   Fransico Him, MD      Important Information About Sugar

## 2022-04-29 ENCOUNTER — Other Ambulatory Visit: Payer: Self-pay | Admitting: Internal Medicine

## 2022-05-01 ENCOUNTER — Telehealth: Payer: Self-pay | Admitting: Internal Medicine

## 2022-05-01 MED ORDER — RABEPRAZOLE SODIUM 20 MG PO TBEC: 20.0000 mg | DELAYED_RELEASE_TABLET | Freq: Two times a day (BID) | ORAL | 0 refills | Status: DC

## 2022-05-01 NOTE — Telephone Encounter (Signed)
Inbound call from patient is requesting refill for Aciphex. Patient would like it called in to [facility], on [address].

## 2022-07-13 ENCOUNTER — Other Ambulatory Visit: Payer: Self-pay | Admitting: Cardiology

## 2022-07-13 DIAGNOSIS — R0789 Other chest pain: Secondary | ICD-10-CM

## 2022-07-13 DIAGNOSIS — R931 Abnormal findings on diagnostic imaging of heart and coronary circulation: Secondary | ICD-10-CM

## 2022-08-02 ENCOUNTER — Other Ambulatory Visit: Payer: Self-pay | Admitting: Internal Medicine

## 2022-08-18 ENCOUNTER — Encounter: Payer: Self-pay | Admitting: Internal Medicine

## 2022-08-18 ENCOUNTER — Ambulatory Visit (INDEPENDENT_AMBULATORY_CARE_PROVIDER_SITE_OTHER): Payer: BC Managed Care – PPO | Admitting: Internal Medicine

## 2022-08-18 VITALS — BP 112/80 | HR 76 | Ht 77.0 in | Wt 272.0 lb

## 2022-08-18 DIAGNOSIS — K760 Fatty (change of) liver, not elsewhere classified: Secondary | ICD-10-CM

## 2022-08-18 DIAGNOSIS — R198 Other specified symptoms and signs involving the digestive system and abdomen: Secondary | ICD-10-CM

## 2022-08-18 DIAGNOSIS — Z8719 Personal history of other diseases of the digestive system: Secondary | ICD-10-CM | POA: Diagnosis not present

## 2022-08-18 DIAGNOSIS — K219 Gastro-esophageal reflux disease without esophagitis: Secondary | ICD-10-CM | POA: Diagnosis not present

## 2022-08-18 MED ORDER — RABEPRAZOLE SODIUM 20 MG PO TBEC: 20.0000 mg | DELAYED_RELEASE_TABLET | Freq: Two times a day (BID) | ORAL | 3 refills | Status: DC

## 2022-08-18 NOTE — Progress Notes (Signed)
Subjective:    Patient ID: Timothy Dixon, male    DOB: 03-28-1960, 62 y.o.   MRN: [id number]  HPI Timothy Dixon is a 62 year old male with a history of GERD, remote Barrett's esophagus not seen on most recent EGD, history of hiatal hernia, prior esophageal dysphagia responsive to dilation, history of gastric hyperplastic and fundic gland polyps, metabolic fatty liver disease, hyperlipidemia and previous provoked DVT off anticoagulation who is here for follow-up.  He was last seen in the office on 03/26/2021 and for upper endoscopy on 03/31/2021.  See EGD report from February 2023 for details  He reports he is doing well.  He had lost about 50 pounds through the [employer] weight loss program.  He noticed with weight loss that he had lost muscle mass and did not feel as healthy and so he has gained about 20 pounds of this back.  He has continued Vitamin E 800 IU daily and B12.  His reflux is under well control with Aciphex twice daily and he has not had recurrent dysphagia after esophageal dilation last year.  He had COVID-19 in October 23 followed by URI and since that time he has noticed a different smell to his stool smells slightly "sweeter".  He is not having diarrhea, constipation, mucus or blood in stool.  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 His daughter is working in [employer] and studying for medical school.  She is going to retake the MCAT soon.  She is 72 year old.  His son just graduated from [employer] and is going to take a job in [place] for [place] baseball.  He hopes his wife will retire soon as well.   Review of Systems As per HPI, otherwise negative Current Medications, Allergies, Past Medical History, Past Surgical History, Family History and Social History were reviewed in [employer] record.    Objective:   Physical Exam BP 112/80 (BP Location: Left Arm, Patient Position: Sitting, Cuff Size: Normal)   Pulse 76   Ht 6\' 5"   (1.956 m)   Wt 272 lb (123.4 kg)   SpO2 96%   BMI 32.25 kg/m  Gen: awake, alert, NAD HEENT: anicteric  Neuro: nonfocal  Labs from PCP planned 08/31/22      Assessment & Plan:   62 year old male with a history of GERD, remote Barrett's esophagus not seen on most recent EGD, history of hiatal hernia, prior esophageal dysphagia responsive to dilation, history of gastric hyperplastic and fundic gland polyps, metabolic fatty liver disease, hyperlipidemia and previous provoked DVT off anticoagulation who is here for follow-up.   GERD with a history of remote Barrett's esophagus/esophageal dysphagia responsive to dilation --no recent dysphagia and GERD is well-controlled. --Continue Aciphex 20 mg daily --Consider surveillance endoscopy around February 2026  2.  History of hyperplastic and fundic gland gastric polyps --no dysplasia.  No bleeding.  No symptoms. --We will be surveying Barrett's esophagus in 2026 and can survey gastric polyps at that time  3.  Metabolic associated steatosis of the liver --he had been successful at weight loss though he is gaining some of this weight back.  He has continued Vitamin E.  Risk factor modification continues to be important --CBC, CMP and INR requested from primary care at labs later this month; I have asked that Dr. Kateri Plummer fax me these results when available --Continue vitamin E 800 IU daily --Continue healthy diet and exercise --If liver enzymes remain elevated consider ultrasound elastography  4.  CRC screening --repeat screening colonoscopy recommended around February 2028  5.  Slight change in bowel habit postinfection --trial of align 1 capsule daily x 1 month  Annual follow-up, sooner if needed

## 2022-08-18 NOTE — Patient Instructions (Addendum)
Start over the [employer] (probiotic) daily.  Remain on Vitamin E 800 mg daily and Vitamin B12 daily.   We have sent the following medications to your pharmacy for you to pick up at your convenience: Aciphex.   Please have your primary care physician draw a CBC, CMet and INR level and fax the results to our office at [phone].  _______________________________________________________  If your blood pressure at your visit was 140/90 or greater, please contact your primary care physician to follow up on this.  _______________________________________________________  If you are age 36 or older, your body mass index should be between 23-30. Your Body mass index is 32.25 kg/m. If this is out of the aforementioned range listed, please consider follow up with your Primary Care Provider.  If you are age 53 or younger, your body mass index should be between 19-25. Your Body mass index is 32.25 kg/m. If this is out of the aformentioned range listed, please consider follow up with your Primary Care Provider.   ________________________________________________________  The [facility] providers would like to encourage you to use [facility] to communicate with providers for non-urgent requests or questions.  Due to long hold times on the telephone, sending your provider a message by [facility] may be a faster and more efficient way to get a response.  Please allow 48 business hours for a response.  Please remember that this is for non-urgent requests.  _______________________________________________________

## 2022-10-07 ENCOUNTER — Encounter: Payer: Self-pay | Admitting: Internal Medicine

## 2022-10-07 ENCOUNTER — Inpatient Hospital Stay: Payer: BC Managed Care – PPO | Attending: Internal Medicine | Admitting: Internal Medicine

## 2022-10-07 ENCOUNTER — Other Ambulatory Visit: Payer: Self-pay | Admitting: *Deleted

## 2022-10-07 ENCOUNTER — Inpatient Hospital Stay: Payer: BC Managed Care – PPO

## 2022-10-07 VITALS — BP 143/94 | HR 87 | Temp 97.9°F | Ht 77.0 in | Wt 281.8 lb

## 2022-10-07 DIAGNOSIS — Z79899 Other long term (current) drug therapy: Secondary | ICD-10-CM | POA: Diagnosis not present

## 2022-10-07 DIAGNOSIS — Z86718 Personal history of other venous thrombosis and embolism: Secondary | ICD-10-CM | POA: Insufficient documentation

## 2022-10-07 DIAGNOSIS — D696 Thrombocytopenia, unspecified: Secondary | ICD-10-CM | POA: Diagnosis present

## 2022-10-07 DIAGNOSIS — Z808 Family history of malignant neoplasm of other organs or systems: Secondary | ICD-10-CM | POA: Diagnosis not present

## 2022-10-07 DIAGNOSIS — Z8719 Personal history of other diseases of the digestive system: Secondary | ICD-10-CM | POA: Diagnosis not present

## 2022-10-07 DIAGNOSIS — Z833 Family history of diabetes mellitus: Secondary | ICD-10-CM | POA: Insufficient documentation

## 2022-10-07 DIAGNOSIS — I2510 Atherosclerotic heart disease of native coronary artery without angina pectoris: Secondary | ICD-10-CM | POA: Diagnosis not present

## 2022-10-07 DIAGNOSIS — Z8249 Family history of ischemic heart disease and other diseases of the circulatory system: Secondary | ICD-10-CM | POA: Insufficient documentation

## 2022-10-07 LAB — CBC WITH DIFFERENTIAL/PLATELET
Abs Immature Granulocytes: 0.01 10*3/uL (ref 0.00–0.07)
Basophils Absolute: 0 10*3/uL (ref 0.0–0.1)
Basophils Relative: 0 %
Eosinophils Absolute: 0.2 10*3/uL (ref 0.0–0.5)
Eosinophils Relative: 4 %
HCT: 42.7 % (ref 39.0–52.0)
Hemoglobin: 14.4 g/dL (ref 13.0–17.0)
Immature Granulocytes: 0 %
Lymphocytes Relative: 24 %
Lymphs Abs: 1.2 10*3/uL (ref 0.7–4.0)
MCH: 27.4 pg (ref 26.0–34.0)
MCHC: 33.7 g/dL (ref 30.0–36.0)
MCV: 81.2 fL (ref 80.0–100.0)
Monocytes Absolute: 0.8 10*3/uL (ref 0.1–1.0)
Monocytes Relative: 16 %
Neutro Abs: 2.7 10*3/uL (ref 1.7–7.7)
Neutrophils Relative %: 56 %
Platelets: 135 10*3/uL — ABNORMAL LOW (ref 150–400)
RBC: 5.26 MIL/uL (ref 4.22–5.81)
RDW: 13.2 % (ref 11.5–15.5)
WBC: 4.8 10*3/uL (ref 4.0–10.5)
nRBC: 0 % (ref 0.0–0.2)

## 2022-10-07 LAB — TECHNOLOGIST SMEAR REVIEW: Plt Morphology: ADEQUATE

## 2022-10-07 LAB — COMPREHENSIVE METABOLIC PANEL
ALT: 26 U/L (ref 0–44)
AST: 24 U/L (ref 15–41)
Albumin: 4.1 g/dL (ref 3.5–5.0)
Alkaline Phosphatase: 70 U/L (ref 38–126)
Anion gap: 7 (ref 5–15)
BUN: 17 mg/dL (ref 6–20)
CO2: 24 mmol/L (ref 22–32)
Calcium: 8.8 mg/dL — ABNORMAL LOW (ref 8.9–10.3)
Chloride: 106 mmol/L (ref 98–111)
Creatinine, Ser: 1.13 mg/dL (ref 0.61–1.24)
GFR, Estimated: >60 mL/min (ref >60)
Glucose, Bld: 140 mg/dL — ABNORMAL HIGH (ref 70–99)
Potassium: 4.2 mmol/L (ref 3.5–5.1)
Sodium: 137 mmol/L (ref 135–145)
Total Bilirubin: 0.8 mg/dL (ref 0.3–1.2)
Total Protein: 7.1 g/dL (ref 6.5–8.1)

## 2022-10-07 LAB — VITAMIN B12: Vitamin B-12: 710 pg/mL (ref 180–914)

## 2022-10-07 LAB — LACTATE DEHYDROGENASE: LDH: 136 U/L (ref 98–192)

## 2022-10-07 NOTE — Progress Notes (Signed)
[facility] CONSULT NOTE  Patient Care Team: Farris Has, MD as PCP - General (Family Medicine) Quintella Reichert, MD as PCP - Cardiology (Cardiology) Earna Coder, MD as Consulting Physician (Oncology)  CHIEF COMPLAINTS/PURPOSE OF CONSULTATION: Thrombocytopenia  HISTORY OF PRESENTING ILLNESS: Patient ambulating-independently. Alone.  Timothy Dixon 62 y.o.  male referred to [place] for further evaluation of thrombocytopenia.  Lost 50 pounds in last 3 months [[place] of weight]-. Patient took herbal medications for weight loss- currently off any medications for last 6 months. Prior known history of thrombocytopenia:none  Easy bruising/bleeding: none  Alcohol: rarely.   Antiplatelet therapy/blood thinners: stopped recently asprin.   Hepatitis/HIV: none  Liver disease/CHF: none   Review of Systems  Constitutional:  Negative for chills, diaphoresis, fever, malaise/fatigue and weight loss.  HENT:  Negative for nosebleeds and sore throat.   Eyes:  Negative for double vision.  Respiratory:  Negative for cough, hemoptysis, sputum production, shortness of breath and wheezing.   Cardiovascular:  Negative for chest pain, palpitations, orthopnea and leg swelling.  Gastrointestinal:  Negative for abdominal pain, blood in stool, constipation, diarrhea, heartburn, melena, nausea and vomiting.  Genitourinary:  Negative for dysuria, frequency and urgency.  Musculoskeletal:  Negative for back pain and joint pain.  Skin: Negative.  Negative for itching and rash.  Neurological:  Negative for dizziness, tingling, focal weakness, weakness and headaches.  Endo/Heme/Allergies:  Does not bruise/bleed easily.  Psychiatric/Behavioral:  Negative for depression. The patient is not nervous/anxious and does not have insomnia.      MEDICAL HISTORY:  Past Medical History:  Diagnosis Date   Anxiety    Barrett esophagus    CAD (coronary artery disease), native coronary artery     Coronary CTA 06/2021 showed Coronary CTA showed coronary calcium score of 121 with 50 to 69% mixed plaque in the proximal RCA, 25 to 49% mixed plaque in the mid and distal LAD, moderate 50 to 69% mixed plaque in the first diagonal, mild ostial 25 to 50% stenosis of the ramus intermedius and mildly calcified aortic valve.   Diabetes mellitus type 2, controlled (HCC)    DVT (deep venous thrombosis) (HCC)    Elevated LFTs    Esophageal reflux    Fatty liver    Gallbladder polyp    Gastric polyp 2020   hyperplastic   GERD (gastroesophageal reflux disease)    Hepatic steatosis    Hiatal hernia    Hypercholesterolemia    OSA (obstructive sleep apnea)    Sleep apnea     SURGICAL HISTORY: Past Surgical History:  Procedure Laterality Date   CARDIAC CATHETERIZATION  02/17/2008   Normal coronary arteries by cath   COLONOSCOPY     ESOPHAGEAL MANOMETRY N/A 09/26/2012   Procedure: ESOPHAGEAL MANOMETRY (EM);  Surgeon: Mardella Layman, MD;  Location: [facility];  Service: Endoscopy;  Laterality: N/A;   PLANTAR FASCIA SURGERY Left 2022   UPPER GASTROINTESTINAL ENDOSCOPY      SOCIAL HISTORY: Social History   Socioeconomic History   Marital status: Married    Spouse name: Not on file   Number of children: Not on file   Years of education: Not on file   Highest education level: Not on file  Occupational History   Occupation: Tree surgeon: UNEMPLOYED  Tobacco Use   Smoking status: Never   Smokeless tobacco: Never  Vaping Use   Vaping status: Never Used  Substance and Sexual Activity   Alcohol use: Yes  Comment: rare beer   Drug use: No   Sexual activity: Not on file  Other Topics Concern   Not on file  Social History Narrative   Not on file   Social Determinants of Health   Financial Resource Strain: Not on file  Food Insecurity: Not on file  Transportation Needs: Not on file  Physical Activity: Not on file  Stress: Not on file  Social Connections:  Not on file  Intimate Partner Violence: Not on file    FAMILY HISTORY: Family History  Problem Relation Age of Onset   Diabetes Mother    Skin cancer Mother    Diabetes Father    Hypertension Brother    Diabetes Brother    Heart disease Paternal Grandfather    Heart disease Other    Colon cancer Neg Hx    Esophageal cancer Neg Hx    Pancreatic cancer Neg Hx    Liver cancer Neg Hx    Stomach cancer Neg Hx     ALLERGIES:  has No Known Allergies.  MEDICATIONS:  Current Outpatient Medications  Medication Sig Dispense Refill   atorvastatin (LIPITOR) 20 MG tablet Take 20 mg by mouth daily at 6 PM.      cycloSPORINE, PF, (CEQUA) 0.09 % SOLN 1 drop into affected eye Ophthalmic twice a day     metFORMIN (GLUCOPHAGE) 1000 MG tablet Take 1,000 mg by mouth 2 (two) times daily.     nebivolol (BYSTOLIC) 2.5 MG tablet Take 1 tablet (2.5 mg total) by mouth daily. 90 tablet 3   RABEprazole (ACIPHEX) 20 MG tablet Take 1 tablet (20 mg total) by mouth 2 (two) times daily. 180 tablet 3   vitamin B-12 (CYANOCOBALAMIN) 1000 MCG tablet Take 1,000 mcg by mouth daily.     vitamin E 400 UNIT capsule Take 800 Units by mouth daily. Two capsules daily     aspirin EC 81 MG tablet Take 1 tablet (81 mg total) by mouth daily. Swallow whole. (Patient not taking: Reported on 10/07/2022) 90 tablet 3   nitroGLYCERIN (NITROSTAT) 0.4 MG SL tablet Place 1 tablet (0.4 mg total) under the tongue every 5 (five) minutes as needed for chest pain. one tab every 5 minutes up to 3 tablets total over 15 minutes.  If no relief CALL 911. 25 tablet 3   No current facility-administered medications for this visit.    PHYSICAL EXAMINATION:  Vitals:   10/07/22 1343  BP: (!) 143/94  Pulse: 87  Temp: 97.9 F (36.6 C)  SpO2: 97%   Filed Weights   10/07/22 1343  Weight: 281 lb 12.8 oz (127.8 kg)    Physical Exam Vitals and nursing note reviewed.  HENT:     Head: Normocephalic and atraumatic.     Mouth/Throat:      Pharynx: Oropharynx is clear.  Eyes:     Extraocular Movements: Extraocular movements intact.     Pupils: Pupils are equal, round, and reactive to light.  Cardiovascular:     Rate and Rhythm: Normal rate and regular rhythm.  Pulmonary:     Comments: Decreased breath sounds bilaterally.  Abdominal:     Palpations: Abdomen is soft.  Musculoskeletal:        General: Normal range of motion.     Cervical back: Normal range of motion.  Skin:    General: Skin is warm.  Neurological:     General: No focal deficit present.     Mental Status: He is alert and oriented to person, place,  and time.  Psychiatric:        Behavior: Behavior normal.        Judgment: Judgment normal.      LABORATORY DATA:  I have reviewed the data as listed Lab Results  Component Value Date   WBC 5.8 03/26/2021   HGB 14.7 03/26/2021   HCT 43.0 03/26/2021   MCV 78.8 03/26/2021   PLT 153.0 03/26/2021   No results for input(s): "NA", "K", "CL", "CO2", "GLUCOSE", "BUN", "CREATININE", "CALCIUM", "GFRNONAA", "GFRAA", "PROT", "ALBUMIN", "AST", "ALT", "ALKPHOS", "BILITOT", "BILIDIR", "IBILI" in the last 8760 hours.  RADIOGRAPHIC STUDIES: I have personally reviewed the radiological images as listed and agreed with the findings in the report. No results found.  Thrombocytopenia (HCC) Thrombocytopenia: Mild/moderate [AUG 2024- 94; normal hb/WBC: renal/LFTs].  The etiology is unclear. Denies any Exposure to Hepatitis/HIV. 2022- MRi liver- NEGATIVE for  cirrhosis/ splenomegaly.  #  I do long discussion with patient regarding multiple etiologies including but not limited to liver disease/medications/autoimmune disease-ITP etc.  Less likely any malignant causes.  Discussed that ITP is a diagnosis of exclusion.  ITP is usually treated with steroids.    # Recommend further work-up including-CBC CMP LDH; review of peripheral smear;  Discussed the role of a bone marrow biopsy for further evaluation of hematologic disorder.   However hold off pending above work-up.  Low clinical suspicion of malignancy.  Thank you Dr.Morrow for allowing me to participate in the care of your pleasant patient. Please do not hesitate to contact me with questions or concerns in the interim.   # DISPOSITION: # labs today-  # follow up TBD- Dr.B   All questions were answered. The patient knows to call the clinic with any problems, questions or concerns.    Earna Coder, MD 10/07/2022 2:37 PM

## 2022-10-07 NOTE — Progress Notes (Signed)
Unable to reach the patient I left a voicemail for the patient-platelets improving would recommend a follow-up in 3 to 4 months.  Message sent to my staff.

## 2022-10-07 NOTE — Progress Notes (Signed)
Bruising: no Bleeding from gums: no  Does have some trouble sleeping, has cpap.  Has occasional chest tightness.

## 2022-10-07 NOTE — Assessment & Plan Note (Addendum)
Thrombocytopenia: Mild/moderate [AUG 2024- 94; normal hb/WBC: renal/LFTs].  The etiology is unclear. Denies any Exposure to Hepatitis/HIV. 2022- MRi liver- NEGATIVE for  cirrhosis/ splenomegaly.  #  I do long discussion with patient regarding multiple etiologies including but not limited to liver disease/medications/autoimmune disease-ITP etc.  Less likely any malignant causes.  Discussed that ITP is a diagnosis of exclusion.  ITP is usually treated with steroids.    # Recommend further work-up including-CBC CMP LDH; review of peripheral smear;  Discussed the role of a bone marrow biopsy for further evaluation of hematologic disorder.  However hold off pending above work-up.  Low clinical suspicion of malignancy.  Thank you Dr.Morrow for allowing me to participate in the care of your pleasant patient. Please do not hesitate to contact me with questions or concerns in the interim.   # DISPOSITION: # labs today-  # follow up TBD- Dr.B

## 2023-01-18 ENCOUNTER — Inpatient Hospital Stay: Payer: 59 | Attending: Internal Medicine

## 2023-01-18 ENCOUNTER — Encounter: Payer: Self-pay | Admitting: Internal Medicine

## 2023-01-18 ENCOUNTER — Inpatient Hospital Stay (HOSPITAL_BASED_OUTPATIENT_CLINIC_OR_DEPARTMENT_OTHER): Payer: 59 | Admitting: Internal Medicine

## 2023-01-18 VITALS — BP 140/85 | HR 77 | Temp 97.2°F | Ht 77.0 in | Wt 280.4 lb

## 2023-01-18 DIAGNOSIS — Z79899 Other long term (current) drug therapy: Secondary | ICD-10-CM | POA: Insufficient documentation

## 2023-01-18 DIAGNOSIS — Z808 Family history of malignant neoplasm of other organs or systems: Secondary | ICD-10-CM | POA: Insufficient documentation

## 2023-01-18 DIAGNOSIS — Z833 Family history of diabetes mellitus: Secondary | ICD-10-CM | POA: Diagnosis not present

## 2023-01-18 DIAGNOSIS — Z8719 Personal history of other diseases of the digestive system: Secondary | ICD-10-CM | POA: Insufficient documentation

## 2023-01-18 DIAGNOSIS — D696 Thrombocytopenia, unspecified: Secondary | ICD-10-CM | POA: Diagnosis not present

## 2023-01-18 DIAGNOSIS — K219 Gastro-esophageal reflux disease without esophagitis: Secondary | ICD-10-CM | POA: Diagnosis not present

## 2023-01-18 DIAGNOSIS — Z8249 Family history of ischemic heart disease and other diseases of the circulatory system: Secondary | ICD-10-CM | POA: Diagnosis not present

## 2023-01-18 DIAGNOSIS — Z86718 Personal history of other venous thrombosis and embolism: Secondary | ICD-10-CM | POA: Insufficient documentation

## 2023-01-18 LAB — CBC WITH DIFFERENTIAL (CANCER CENTER ONLY)
Abs Immature Granulocytes: 0.02 10*3/uL (ref 0.00–0.07)
Basophils Absolute: 0 10*3/uL (ref 0.0–0.1)
Basophils Relative: 1 %
Eosinophils Absolute: 0.2 10*3/uL (ref 0.0–0.5)
Eosinophils Relative: 3 %
HCT: 43.8 % (ref 39.0–52.0)
Hemoglobin: 14.8 g/dL (ref 13.0–17.0)
Immature Granulocytes: 0 %
Lymphocytes Relative: 31 %
Lymphs Abs: 1.8 10*3/uL (ref 0.7–4.0)
MCH: 27.3 pg (ref 26.0–34.0)
MCHC: 33.8 g/dL (ref 30.0–36.0)
MCV: 80.8 fL (ref 80.0–100.0)
Monocytes Absolute: 0.6 10*3/uL (ref 0.1–1.0)
Monocytes Relative: 11 %
Neutro Abs: 3.2 10*3/uL (ref 1.7–7.7)
Neutrophils Relative %: 54 %
Platelet Count: 145 10*3/uL — ABNORMAL LOW (ref 150–400)
RBC: 5.42 MIL/uL (ref 4.22–5.81)
RDW: 13 % (ref 11.5–15.5)
WBC Count: 5.8 10*3/uL (ref 4.0–10.5)
nRBC: 0.3 % — ABNORMAL HIGH (ref 0.0–0.2)

## 2023-01-18 NOTE — Assessment & Plan Note (Addendum)
Thrombocytopenia: Mild/moderate [AUG 2024- 94; normal hb/WBC: renal/LFTs].  The etiology is unclear. Denies any Exposure to Hepatitis/HIV. 2022- MRi liver- NEGATIVE for  cirrhosis/ splenomegaly. NOV 2024- platelets- 145.   #  I do long discussion with patient regarding multiple etiologies including but not limited to liver disease/medications/autoimmune disease-ITP etc.  Less likely any malignant causes.  Discussed that ITP is a diagnosis of exclusion.  ITP is usually treated with steroids.      # DISPOSITION: # follow up in 6 months- MD;  labs- cbc/cmp;LDH- Dr.B

## 2023-01-18 NOTE — Progress Notes (Signed)
Bruising: no Bleeding from gums: no  Retired from [employer] in Sept 2024.

## 2023-01-18 NOTE — Progress Notes (Signed)
[facility] CONSULT NOTE  Patient Care Team: Farris Has, MD as PCP - General (Family Medicine) Quintella Reichert, MD as PCP - Cardiology (Cardiology) Earna Coder, MD as Consulting Physician (Oncology)  CHIEF COMPLAINTS/PURPOSE OF CONSULTATION: Thrombocytopenia  HISTORY OF PRESENTING ILLNESS: Patient ambulating-independently. Alone.  Timothy Dixon 62 y.o.  male is here for a follow up of  thrombocytopenia.  Patient denies any bleeding or easy bruising.   Review of Systems  Constitutional:  Negative for chills, diaphoresis, fever, malaise/fatigue and weight loss.  HENT:  Negative for nosebleeds and sore throat.   Eyes:  Negative for double vision.  Respiratory:  Negative for cough, hemoptysis, sputum production, shortness of breath and wheezing.   Cardiovascular:  Negative for chest pain, palpitations, orthopnea and leg swelling.  Gastrointestinal:  Negative for abdominal pain, blood in stool, constipation, diarrhea, heartburn, melena, nausea and vomiting.  Genitourinary:  Negative for dysuria, frequency and urgency.  Musculoskeletal:  Negative for back pain and joint pain.  Skin: Negative.  Negative for itching and rash.  Neurological:  Negative for dizziness, tingling, focal weakness, weakness and headaches.  Endo/Heme/Allergies:  Does not bruise/bleed easily.  Psychiatric/Behavioral:  Negative for depression. The patient is not nervous/anxious and does not have insomnia.      MEDICAL HISTORY:  Past Medical History:  Diagnosis Date   Anxiety    Barrett esophagus    CAD (coronary artery disease), native coronary artery    Coronary CTA 06/2021 showed Coronary CTA showed coronary calcium score of 121 with 50 to 69% mixed plaque in the proximal RCA, 25 to 49% mixed plaque in the mid and distal LAD, moderate 50 to 69% mixed plaque in the first diagonal, mild ostial 25 to 50% stenosis of the ramus intermedius and mildly calcified aortic valve.   Diabetes  mellitus type 2, controlled (HCC)    DVT (deep venous thrombosis) (HCC)    Elevated LFTs    Esophageal reflux    Fatty liver    Gallbladder polyp    Gastric polyp 2020   hyperplastic   GERD (gastroesophageal reflux disease)    Hepatic steatosis    Hiatal hernia    Hypercholesterolemia    OSA (obstructive sleep apnea)    Sleep apnea     SURGICAL HISTORY: Past Surgical History:  Procedure Laterality Date   CARDIAC CATHETERIZATION  02/17/2008   Normal coronary arteries by cath   COLONOSCOPY     ESOPHAGEAL MANOMETRY N/A 09/26/2012   Procedure: ESOPHAGEAL MANOMETRY (EM);  Surgeon: Mardella Layman, MD;  Location: [facility];  Service: Endoscopy;  Laterality: N/A;   PLANTAR FASCIA SURGERY Left 2022   UPPER GASTROINTESTINAL ENDOSCOPY      SOCIAL HISTORY: Social History   Socioeconomic History   Marital status: Married    Spouse name: Not on file   Number of children: Not on file   Years of education: Not on file   Highest education level: Not on file  Occupational History   Occupation: Tree surgeon: UNEMPLOYED  Tobacco Use   Smoking status: Never   Smokeless tobacco: Never  Vaping Use   Vaping status: Never Used  Substance and Sexual Activity   Alcohol use: Yes    Comment: rare beer   Drug use: No   Sexual activity: Not on file  Other Topics Concern   Not on file  Social History Narrative   Not on file   Social Determinants of Health   Financial  Resource Strain: Not on file  Food Insecurity: No Food Insecurity (10/07/2022)   Hunger Vital Sign    Worried About Running Out of Food in the Last Year: Never true    Ran Out of Food in the Last Year: Never true  Transportation Needs: No Transportation Needs (10/07/2022)   PRAPARE - Administrator, Civil Service (Medical): No    Lack of Transportation (Non-Medical): No  Physical Activity: Not on file  Stress: Not on file  Social Connections: Not on file  Intimate Partner Violence:  Not At Risk (10/07/2022)   Humiliation, Afraid, Rape, and Kick questionnaire    Fear of Current or Ex-Partner: No    Emotionally Abused: No    Physically Abused: No    Sexually Abused: No    FAMILY HISTORY: Family History  Problem Relation Age of Onset   Diabetes Mother    Skin cancer Mother    Diabetes Father    Hypertension Brother    Diabetes Brother    Heart disease Paternal Grandfather    Heart disease Other    Colon cancer Neg Hx    Esophageal cancer Neg Hx    Pancreatic cancer Neg Hx    Liver cancer Neg Hx    Stomach cancer Neg Hx     ALLERGIES:  has No Known Allergies.  MEDICATIONS:  Current Outpatient Medications  Medication Sig Dispense Refill   atorvastatin (LIPITOR) 20 MG tablet Take 20 mg by mouth daily at 6 PM.      cycloSPORINE, PF, (CEQUA) 0.09 % SOLN 1 drop into affected eye Ophthalmic twice a day     metFORMIN (GLUCOPHAGE) 1000 MG tablet Take 1,000 mg by mouth 2 (two) times daily.     nebivolol (BYSTOLIC) 2.5 MG tablet Take 1 tablet (2.5 mg total) by mouth daily. 90 tablet 3   RABEprazole (ACIPHEX) 20 MG tablet Take 1 tablet (20 mg total) by mouth 2 (two) times daily. 180 tablet 3   vitamin B-12 (CYANOCOBALAMIN) 1000 MCG tablet Take 1,000 mcg by mouth daily.     vitamin E 400 UNIT capsule Take 800 Units by mouth daily. Two capsules daily     nitroGLYCERIN (NITROSTAT) 0.4 MG SL tablet Place 1 tablet (0.4 mg total) under the tongue every 5 (five) minutes as needed for chest pain. one tab every 5 minutes up to 3 tablets total over 15 minutes.  If no relief CALL 911. 25 tablet 3   No current facility-administered medications for this visit.    PHYSICAL EXAMINATION:  Vitals:   01/18/23 1248  BP: (!) 140/85  Pulse: 77  Temp: (!) 97.2 F (36.2 C)  SpO2: 100%    Filed Weights   01/18/23 1248  Weight: 280 lb 6.4 oz (127.2 kg)     Physical Exam Vitals and nursing note reviewed.  HENT:     Head: Normocephalic and atraumatic.     Mouth/Throat:      Pharynx: Oropharynx is clear.  Eyes:     Extraocular Movements: Extraocular movements intact.     Pupils: Pupils are equal, round, and reactive to light.  Cardiovascular:     Rate and Rhythm: Normal rate and regular rhythm.  Pulmonary:     Comments: Decreased breath sounds bilaterally.  Abdominal:     Palpations: Abdomen is soft.  Musculoskeletal:        General: Normal range of motion.     Cervical back: Normal range of motion.  Skin:    General: Skin is  warm.  Neurological:     General: No focal deficit present.     Mental Status: He is alert and oriented to person, place, and time.  Psychiatric:        Behavior: Behavior normal.        Judgment: Judgment normal.      LABORATORY DATA:  I have reviewed the data as listed Lab Results  Component Value Date   WBC 5.8 01/18/2023   HGB 14.8 01/18/2023   HCT 43.8 01/18/2023   MCV 80.8 01/18/2023   PLT 145 (L) 01/18/2023   Recent Labs    10/07/22 1443  NA 137  K 4.2  CL 106  CO2 24  GLUCOSE 140*  BUN 17  CREATININE 1.13  CALCIUM 8.8*  GFRNONAA >60  PROT 7.1  ALBUMIN 4.1  AST 24  ALT 26  ALKPHOS 70  BILITOT 0.8    RADIOGRAPHIC STUDIES: I have personally reviewed the radiological images as listed and agreed with the findings in the report. No results found.  Thrombocytopenia (HCC) Thrombocytopenia: Mild/moderate [AUG 2024- 94; normal hb/WBC: renal/LFTs].  The etiology is unclear. Denies any Exposure to Hepatitis/HIV. 2022- MRi liver- NEGATIVE for  cirrhosis/ splenomegaly. NOV 2024- platelets- 145.   #  I do long discussion with patient regarding multiple etiologies including but not limited to liver disease/medications/autoimmune disease-ITP etc.  Less likely any malignant causes.  Discussed that ITP is a diagnosis of exclusion.  ITP is usually treated with steroids.      # DISPOSITION: # follow up in 6 months- MD;  labs- cbc/cmp;LDH- Dr.B   All questions were answered. The patient knows to call the clinic  with any problems, questions or concerns.    Earna Coder, MD 01/18/2023 1:25 PM

## 2023-03-05 ENCOUNTER — Other Ambulatory Visit: Payer: Self-pay | Admitting: Cardiology

## 2023-03-26 ENCOUNTER — Encounter: Payer: Self-pay | Admitting: Cardiology

## 2023-03-26 ENCOUNTER — Ambulatory Visit: Payer: 59 | Attending: Cardiology | Admitting: Cardiology

## 2023-03-26 VITALS — BP 98/80 | HR 69 | Ht 77.0 in | Wt 279.0 lb

## 2023-03-26 DIAGNOSIS — E785 Hyperlipidemia, unspecified: Secondary | ICD-10-CM | POA: Diagnosis not present

## 2023-03-26 DIAGNOSIS — G4733 Obstructive sleep apnea (adult) (pediatric): Secondary | ICD-10-CM | POA: Diagnosis not present

## 2023-03-26 DIAGNOSIS — I2510 Atherosclerotic heart disease of native coronary artery without angina pectoris: Secondary | ICD-10-CM

## 2023-03-26 DIAGNOSIS — I10 Essential (primary) hypertension: Secondary | ICD-10-CM

## 2023-03-26 MED ORDER — ICOSAPENT ETHYL 1 G PO CAPS: 2.0000 g | ORAL_CAPSULE | Freq: Two times a day (BID) | ORAL | 3 refills | Status: DC

## 2023-03-26 MED ORDER — NITROGLYCERIN 0.4 MG SL SUBL: 0.4000 mg | SUBLINGUAL_TABLET | SUBLINGUAL | 3 refills | Status: AC | PRN

## 2023-03-26 NOTE — Progress Notes (Signed)
 Cardiology  Note    Date:  03/26/2023   ID:  Timothy Dixon, MRN [id number]  PCP:  Kip Righter, MD  Cardiologist:  Wilbert Bihari, MD   Chief Complaint  Patient presents with   Coronary Artery Disease   Hypertension   Hyperlipidemia   Sleep Apnea    History of Present Illness:  Timothy Dixon is a 63 y.o. male with a history of Barrett esophagus, GERD, gastric polyp, fatty liver, DVT after surgery for plantar faciitis, hyperlipidemia and obstructive sleep apnea who was referred for evaluation of chest pain.  He was seen back in 2015 for his CPAP but was lost to follow-up.  At that time he complained of some atypical chest pain but had a normal cath several years prior.  He underwent exercise treadmill test in 2015 that was normal.  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 He saw me back in 06/2021 with CP.  His symptoms are somewhat atypical in that it was initially sharp and then became a tightness and occurred at rest with no associated sx or radiation. Coronary CTA showed Moderate CAD in the proximal RCA and proximal first diagonal artery (50-69%). Coronary calcium score is 121, which places the patient in the 75th percentile for age and sex matched control. CT FFR analysis showed no significant stenosis in the proximal RCA or first diagonal branch. Indeterminate FFR in distal LAD likely secondary to vessel tapering. Recommended medical therapy.   He is here today for followup and is doing well.  He denies any chest pain or pressure, SOB, DOE, PND, orthopnea, LE edema, dizziness, palpitations or syncope. He is compliant with his meds and is tolerating meds with no SE.    He is doing well with his PAP device and thinks that he has gotten used to it.  He tolerates the mask and feels the pressure is adequate.  Since going on PAP he feels rested in the am and has no significant daytime sleepiness.  He denies any significant mouth or  nasal dryness or nasal congestion.  He does not think that he snores.    Past Medical History:  Diagnosis Date   Anxiety    Barrett esophagus    CAD (coronary artery disease), native coronary artery    Coronary CTA 06/2021 showed Coronary CTA showed coronary calcium score of 121 with 50 to 69% mixed plaque in the proximal RCA, 25 to 49% mixed plaque in the mid and distal LAD, moderate 50 to 69% mixed plaque in the first diagonal, mild ostial 25 to 50% stenosis of the ramus intermedius and mildly calcified aortic valve.   Diabetes mellitus type 2, controlled (HCC)    DVT (deep venous thrombosis) (HCC)    Elevated LFTs    Esophageal reflux    Fatty liver    Gallbladder polyp    Gastric polyp 2020   hyperplastic   GERD (gastroesophageal reflux disease)    Hepatic steatosis    Hiatal hernia    Hypercholesterolemia    OSA (obstructive sleep apnea)    Sleep apnea     Past Surgical History:  Procedure Laterality Date   CARDIAC CATHETERIZATION  02/17/2008   Normal coronary arteries by cath   COLONOSCOPY     ESOPHAGEAL MANOMETRY N/A 09/26/2012   Procedure: ESOPHAGEAL MANOMETRY (EM);  Surgeon: Alm JONELLE Gander, MD;  Location: [facility];  Service: Endoscopy;  Laterality: N/A;   PLANTAR FASCIA SURGERY Left  2022   UPPER GASTROINTESTINAL ENDOSCOPY      Current Medications: Current Meds  Medication Sig   atorvastatin (LIPITOR) 40 MG tablet Take 40 mg by mouth daily.   cycloSPORINE, PF, (CEQUA) 0.09 % SOLN 1 drop into affected eye Ophthalmic twice a day   metFORMIN (GLUCOPHAGE) 1000 MG tablet Take 1,000 mg by mouth 2 (two) times daily.   nebivolol  (BYSTOLIC ) 2.5 MG tablet Take 1 tablet by mouth once daily   nitroGLYCERIN  (NITROSTAT ) 0.4 MG SL tablet Place 1 tablet (0.4 mg total) under the tongue every 5 (five) minutes as needed for chest pain. one tab every 5 minutes up to 3 tablets total over 15 minutes.  If no relief CALL 911.   RABEprazole  (ACIPHEX ) 20 MG tablet Take 1 tablet (20 mg  total) by mouth 2 (two) times daily.   vitamin B-12 (CYANOCOBALAMIN ) 1000 MCG tablet Take 1,000 mcg by mouth daily.   vitamin E  400 UNIT capsule Take 800 Units by mouth daily. Two capsules daily    Allergies:   Patient has no known allergies.   Social History   Socioeconomic History   Marital status: Married    Spouse name: Not on file   Number of children: Not on file   Years of education: Not on file   Highest education level: Not on file  Occupational History   Occupation: Tree Surgeon: UNEMPLOYED  Tobacco Use   Smoking status: Never   Smokeless tobacco: Never  Vaping Use   Vaping status: Never Used  Substance and Sexual Activity   Alcohol use: Yes    Comment: rare beer   Drug use: No   Sexual activity: Not on file  Other Topics Concern   Not on file  Social History Narrative   Not on file   Social Drivers of Health   Financial Resource Strain: Not on file  Food Insecurity: No Food Insecurity (10/07/2022)   Hunger Vital Sign    Worried About Running Out of Food in the Last Year: Never true    Ran Out of Food in the Last Year: Never true  Transportation Needs: No Transportation Needs (10/07/2022)   PRAPARE - Administrator, Civil Service (Medical): No    Lack of Transportation (Non-Medical): No  Physical Activity: Not on file  Stress: Not on file  Social Connections: Not on file     Family History:  The patient's family history includes Diabetes in his brother, father, and mother; Heart disease in his paternal grandfather and another family member; Hypertension in his brother; Skin cancer in his mother.   ROS:   Please see the history of present illness.    ROS All other systems reviewed and are negative.      No data to display           PHYSICAL EXAM:   VS:  BP 98/80   Pulse 69   Ht 6' 5 (1.956 m)   Wt 279 lb (126.6 kg)   BMI 33.08 kg/m    GEN: Well nourished, well developed in no acute distress HEENT:  Normal NECK: No JVD; No carotid bruits LYMPHATICS: No lymphadenopathy CARDIAC:RRR, no murmurs, rubs, gallops RESPIRATORY:  Clear to auscultation without rales, wheezing or rhonchi  ABDOMEN: Soft, non-tender, non-distended MUSCULOSKELETAL:  No edema; No deformity  SKIN: Warm and dry NEUROLOGIC:  Alert and oriented x 3 PSYCHIATRIC:  Normal affect  Wt Readings from Last 3 Encounters:  03/26/23 279 lb (126.6  kg)  01/18/23 280 lb 6.4 oz (127.2 kg)  10/07/22 281 lb 12.8 oz (127.8 kg)      Studies/Labs Reviewed:   EKG Interpretation Date/Time:  Friday March 26 2023 08:43:36 EST Ventricular Rate:  69 PR Interval:  208 QRS Duration:  86 QT Interval:  370 QTC Calculation: 396 R Axis:   48  Text Interpretation: Normal sinus rhythm Normal ECG When compared with ECG of 12-Apr-2013 16:50, PREVIOUS ECG IS PRESENT Confirmed by Shlomo Corning ([id number]) on 03/26/2023 9:08:15 AM   Recent Labs: 10/07/2022: ALT 26; BUN 17; Creatinine, Ser 1.13; Potassium 4.2; Sodium 137 01/18/2023: Hemoglobin 14.8; Platelet Count 145   Lipid Panel    Component Value Date/Time   CHOL (H) 03/23/2007 0345    283        ATP III CLASSIFICATION:  <200     mg/dL   Desirable  799-760  mg/dL   Borderline High  >=759    mg/dL   High   TRIG 720 (H) 97/95/7990 0345   HDL 31 (L) 03/23/2007 0345   CHOLHDL 9.1 03/23/2007 0345   VLDL 56 (H) 03/23/2007 0345   LDLCALC (H) 03/23/2007 0345    196        Total Cholesterol/HDL:CHD Risk Coronary Heart Disease Risk Table                     Men   Women  1/2 Average Risk   3.4   3.3    Additional studies/ records that were reviewed today include:  Prior ETT in 2015    ASSESSMENT:    1. Coronary artery disease involving native coronary artery of native heart without angina pectoris   2. OSA (obstructive sleep apnea)   3. Hyperlipidemia LDL goal <70   4. Essential hypertension      PLAN:  In order of problems listed above:  CAD -Coronary CTA showed 50-69% pD1 and  pRCA with coronary Ca score 121 and CT FFR analysis showed no significant stenosis in the proximal RCA or first diagonal branch. Indeterminate FFR in distal LAD likely secondary to vessel tapering -He denied any anginal symptoms since I saw him last -He will continue prescription drug management with aspirin  81 mg daily, atorvastatin 40 mg daily with PRN Refills  OSA - The patient is tolerating PAP therapy well without any problems. The PAP download performed by his DME was personally reviewed and interpreted by me today and showed an AHI of 1.1 /hr on auto CPAP from 8-18 cm H2O with 100 % compliance in using more than 4 hours nightly.  The patient has been using and benefiting from PAP use and will continue to benefit from therapy.   HLD -LDL goal < 70 -I have personally reviewed and interpreted outside labs performed by patient's PCP which showed LDL 65, HDL 27, TAGs 259>>atorvastatin increased by PCP to 40mg  daily 02/2023 -He will continue prescription drug management with atorvastatin 40 mg daily with as needed refills -add on Vascepa  2gm BID -he has labs pending with PCP in another month  4.  HTN -BP is well-controlled on exam today -Continue prescription drug management with Bystolic  2.5 mg daily with as needed refills -BP is on the low side at 98/110mmHg>>I will stop Bystolic  and get a 48 hour BP monitor     Medication Adjustments/Labs and Tests Ordered: Current medicines are reviewed at length with the patient today.  Concerns regarding medicines are outlined above.  Medication changes, Labs and Tests ordered today  are listed in the Patient Instructions below.  There are no Patient Instructions on file for this visit.   Signed, Wilbert Bihari, MD  03/26/2023 9:03 AM    [facility] [address] [place], [place], [place]  [address] Phone: [phone]; Fax: [phone]

## 2023-03-26 NOTE — Patient Instructions (Signed)
 Medication Instructions:  Please STOP bystolic .  Please START Vascepa  2 gm twice a day.  *If you need a refill on your cardiac medications before your next appointment, please call your pharmacy*   Lab Work: None.  If you have labs (blood work) drawn today and your tests are completely normal, you will receive your results only by: MyChart Message (if you have MyChart) OR A paper copy in the mail If you have any lab test that is abnormal or we need to change your treatment, we will call you to review the results.   Testing/Procedures: Dr. Shlomo has ordered a 24 hour blood pressure monitor for you. There is currently a waiting list. Someone from our office will contact you once it is available.     Follow-Up: At [facility], you and your health needs are our priority.  As part of our continuing mission to provide you with exceptional heart care, we have created designated Provider Care Teams.  These Care Teams include your primary Cardiologist (physician) and Advanced Practice Providers (APPs -  Physician Assistants and Nurse Practitioners) who all work together to provide you with the care you need, when you need it.  We recommend signing up for the patient portal called MyChart.  Sign up information is provided on this After Visit Summary.  MyChart is used to connect with patients for Virtual Visits (Telemedicine).  Patients are able to view lab/test results, encounter notes, upcoming appointments, etc.  Non-urgent messages can be sent to your provider as well.   To learn more about what you can do with MyChart, go to [email or web address].    Your next appointment:   1 year(s)  Provider:   Wilbert Shlomo, MD

## 2023-03-26 NOTE — Addendum Note (Signed)
 Addended by: Cherylyn Cos on: 03/26/2023 09:14 AM   Modules accepted: Orders

## 2023-04-28 ENCOUNTER — Telehealth: Payer: Self-pay

## 2023-04-28 ENCOUNTER — Other Ambulatory Visit (HOSPITAL_COMMUNITY): Payer: Self-pay

## 2023-04-28 NOTE — Telephone Encounter (Signed)
 Patient has tried and failed omeprazole 20 mg daily and twice daily.

## 2023-04-28 NOTE — Telephone Encounter (Signed)
*  Gastro  Pharmacy Patient Advocate Encounter   Received notification from Fax that prior authorization for RABEprazole Sodium 20MG  dr tablets  is required/requested.   Insurance verification completed.   The patient is insured through [facility] .   Per test claim: PA required; PA started via CoverMyMeds. KEY [id number] . Please see clinical question(s) below that I am not finding the answer to in her chart and advise.   What medications has patient failed for this diagnosis- I am just seeing where the patient has been taking Rabeprazole.

## 2023-04-30 NOTE — Telephone Encounter (Signed)
 Request updated and submitted to plan, now pending determination.

## 2023-05-04 NOTE — Telephone Encounter (Signed)
 Pharmacy Patient Advocate Encounter  Received notification from [facility] that Prior Authorization for RABEprazole Sodium 20MG  dr tablets has been APPROVED from 04-30-2023 to 04-29-2024   PA #/Case ID/Reference #: [facility]

## 2023-05-06 ENCOUNTER — Encounter: Payer: Self-pay | Admitting: Cardiology

## 2023-05-07 ENCOUNTER — Other Ambulatory Visit (HOSPITAL_COMMUNITY): Payer: Self-pay

## 2023-05-07 ENCOUNTER — Telehealth: Payer: Self-pay

## 2023-05-07 NOTE — Telephone Encounter (Signed)
 Pharmacy Patient Advocate Encounter   Received notification from Physician's Office that prior authorization for VASCEPA is required/requested.   Insurance verification completed.   The patient is insured through  [facility]  .   Per test claim:  OMEGA 3 (LOVAZA) is preferred by the insurance.  If suggested medication is appropriate, Please send in a new RX and discontinue this one. If not, please advise as to why it's not appropriate so that we may request a Prior Authorization. Please note, some preferred medications may still require a PA.  If the suggested medications have not been trialed and there are no contraindications to their use, the PA will not be submitted, as it will not be approved.

## 2023-05-07 NOTE — Telephone Encounter (Signed)
 Please see separate encounter.

## 2023-05-11 ENCOUNTER — Telehealth: Payer: Self-pay

## 2023-05-11 ENCOUNTER — Encounter: Payer: Self-pay | Admitting: Cardiology

## 2023-05-11 ENCOUNTER — Other Ambulatory Visit (HOSPITAL_COMMUNITY): Payer: Self-pay

## 2023-05-11 DIAGNOSIS — I2510 Atherosclerotic heart disease of native coronary artery without angina pectoris: Secondary | ICD-10-CM

## 2023-05-11 DIAGNOSIS — E785 Hyperlipidemia, unspecified: Secondary | ICD-10-CM

## 2023-05-11 NOTE — Telephone Encounter (Signed)
 It was requested for a twice daily dosing. Pharmacy may have to do override on their end to get it to go through.

## 2023-05-11 NOTE — Telephone Encounter (Signed)
 Called [employer] back and provided requested information.

## 2023-05-11 NOTE — Telephone Encounter (Signed)
 PA request has been Submitted. New Encounter has been or will be created for follow up. For additional info see Pharmacy Prior Auth telephone encounter from 05/11/23.

## 2023-05-11 NOTE — Telephone Encounter (Signed)
 atorvastatin increased by PCP to 40mg  daily 02/2023

## 2023-05-11 NOTE — Telephone Encounter (Signed)
 Will forward this call to our PharmD team for further management, being they have been assisting the pt with this matter.

## 2023-05-11 NOTE — Telephone Encounter (Signed)
 Called [facility] pharmacy and had them re-run the prescription. The pharmacist states the prescription went through and patient can pick it up. They will reach out to patient.

## 2023-05-11 NOTE — Telephone Encounter (Signed)
 Received fax from [facility] pharmacy stating the insurance will only pay for rabeprazole sodium 20 mg max one tablet per day. I see the PA was approved on 05/04/23. Was this for once or twice daily dosing? Patient is taking it twice a day. Thanks

## 2023-05-11 NOTE — Telephone Encounter (Signed)
 Pharmacy Patient Advocate Encounter   Received notification from Physician's Office that prior authorization for ICOSAPENT ETHYL 1 GM CAPSULES is required/requested.   Insurance verification completed.   The patient is insured through [facility] .   Per test claim: PA required; PA submitted to above mentioned insurance via CoverMyMeds Key/confirmation #/EOC [id number] Status is pending  Per [facility] M.Maccia, patient does not require brand Vascepa. Doing PA for generic.

## 2023-05-11 NOTE — Telephone Encounter (Signed)
 Clydie Braun with [facility] called in about PA for this med. She states they need to know when did the pt's atorvastatin increase. Please advise.

## 2023-05-13 NOTE — Telephone Encounter (Signed)
 Pharmacy Patient Advocate Encounter  Received notification from [facility] that Prior Authorization for ICOSAPENT ETHYL has been DENIED.  Full denial letter will be uploaded to the media tab. See denial reason below. CRITERIA NOT MET PER PLAN.

## 2023-05-17 NOTE — Telephone Encounter (Signed)
See my chart message to pt

## 2023-07-19 ENCOUNTER — Inpatient Hospital Stay: Payer: 59 | Admitting: Internal Medicine

## 2023-07-19 ENCOUNTER — Encounter: Payer: Self-pay | Admitting: Internal Medicine

## 2023-07-19 ENCOUNTER — Inpatient Hospital Stay: Payer: 59 | Attending: Internal Medicine

## 2023-07-19 VITALS — BP 141/77 | HR 89 | Temp 97.8°F | Resp 16 | Ht 77.0 in | Wt 275.9 lb

## 2023-07-19 DIAGNOSIS — Z79899 Other long term (current) drug therapy: Secondary | ICD-10-CM | POA: Insufficient documentation

## 2023-07-19 DIAGNOSIS — I2510 Atherosclerotic heart disease of native coronary artery without angina pectoris: Secondary | ICD-10-CM | POA: Insufficient documentation

## 2023-07-19 DIAGNOSIS — D696 Thrombocytopenia, unspecified: Secondary | ICD-10-CM

## 2023-07-19 DIAGNOSIS — Z808 Family history of malignant neoplasm of other organs or systems: Secondary | ICD-10-CM | POA: Diagnosis not present

## 2023-07-19 DIAGNOSIS — Z86718 Personal history of other venous thrombosis and embolism: Secondary | ICD-10-CM | POA: Diagnosis not present

## 2023-07-19 DIAGNOSIS — Z833 Family history of diabetes mellitus: Secondary | ICD-10-CM | POA: Insufficient documentation

## 2023-07-19 DIAGNOSIS — Z8719 Personal history of other diseases of the digestive system: Secondary | ICD-10-CM | POA: Insufficient documentation

## 2023-07-19 DIAGNOSIS — G4733 Obstructive sleep apnea (adult) (pediatric): Secondary | ICD-10-CM | POA: Insufficient documentation

## 2023-07-19 DIAGNOSIS — Z8249 Family history of ischemic heart disease and other diseases of the circulatory system: Secondary | ICD-10-CM | POA: Diagnosis not present

## 2023-07-19 LAB — CMP (CANCER CENTER ONLY)
ALT: 30 U/L (ref 0–44)
AST: 27 U/L (ref 15–41)
Albumin: 4.1 g/dL (ref 3.5–5.0)
Alkaline Phosphatase: 80 U/L (ref 38–126)
Anion gap: 9 (ref 5–15)
BUN: 17 mg/dL (ref 6–20)
CO2: 21 mmol/L — ABNORMAL LOW (ref 22–32)
Calcium: 8.9 mg/dL (ref 8.9–10.3)
Chloride: 106 mmol/L (ref 98–111)
Creatinine: 1.05 mg/dL (ref 0.61–1.24)
GFR, Estimated: >60 mL/min (ref >60)
Glucose, Bld: 169 mg/dL — ABNORMAL HIGH (ref 70–99)
Potassium: 4 mmol/L (ref 3.5–5.1)
Sodium: 136 mmol/L (ref 135–145)
Total Bilirubin: 0.9 mg/dL (ref 0.0–1.2)
Total Protein: 7.3 g/dL (ref 6.5–8.1)

## 2023-07-19 LAB — CBC WITH DIFFERENTIAL (CANCER CENTER ONLY)
Abs Immature Granulocytes: 0.01 10*3/uL (ref 0.00–0.07)
Basophils Absolute: 0 10*3/uL (ref 0.0–0.1)
Basophils Relative: 1 %
Eosinophils Absolute: 0.1 10*3/uL (ref 0.0–0.5)
Eosinophils Relative: 3 %
HCT: 43.5 % (ref 39.0–52.0)
Hemoglobin: 14.4 g/dL (ref 13.0–17.0)
Immature Granulocytes: 0 %
Lymphocytes Relative: 34 %
Lymphs Abs: 1.6 10*3/uL (ref 0.7–4.0)
MCH: 26.5 pg (ref 26.0–34.0)
MCHC: 33.1 g/dL (ref 30.0–36.0)
MCV: 80.1 fL (ref 80.0–100.0)
Monocytes Absolute: 0.5 10*3/uL (ref 0.1–1.0)
Monocytes Relative: 11 %
Neutro Abs: 2.4 10*3/uL (ref 1.7–7.7)
Neutrophils Relative %: 51 %
Platelet Count: 137 10*3/uL — ABNORMAL LOW (ref 150–400)
RBC: 5.43 MIL/uL (ref 4.22–5.81)
RDW: 13.1 % (ref 11.5–15.5)
WBC Count: 4.7 10*3/uL (ref 4.0–10.5)
nRBC: 0 % (ref 0.0–0.2)

## 2023-07-19 LAB — LACTATE DEHYDROGENASE: LDH: 116 U/L (ref 98–192)

## 2023-07-19 NOTE — Assessment & Plan Note (Addendum)
 Thrombocytopenia: Mild/moderate [AUG 2024- 94; normal hb/WBC: renal/LFTs].  The etiology is unclear. Denies any Exposure to Hepatitis/HIV. 2022- MRi liver- NEGATIVE for  cirrhosis/ splenomegaly. NOV 2024- platelets- 145; JUNE 2025- 137.    #  I do long discussion with patient regarding multiple etiologies including but not limited to liver disease/medications/autoimmune disease-ITP etc.  Less likely any malignant causes.  Discussed that ITP is a diagnosis of exclusion.  ITP is usually treated with steroids.     # DISPOSITION: # follow up in 6 months- MD;  labs- cbc/cmp;LDH- Dr.B

## 2023-07-19 NOTE — Progress Notes (Signed)
 [facility] CONSULT NOTE  Patient Care Team: Ronna Coho, MD as PCP - General (Family Medicine) Jacqueline Matsu, MD as PCP - Cardiology (Cardiology) Gwyn Leos, MD as Consulting Physician (Oncology)  CHIEF COMPLAINTS/PURPOSE OF CONSULTATION: Thrombocytopenia  HISTORY OF PRESENTING ILLNESS: Patient ambulating-independently. Alone.  Timothy Dixon 63 y.o.  male is here for a follow up of  thrombocytopenia.  Patient denies any bleeding or easy bruising.   Review of Systems  Constitutional:  Negative for chills, diaphoresis, fever, malaise/fatigue and weight loss.  HENT:  Negative for nosebleeds and sore throat.   Eyes:  Negative for double vision.  Respiratory:  Negative for cough, hemoptysis, sputum production, shortness of breath and wheezing.   Cardiovascular:  Negative for chest pain, palpitations, orthopnea and leg swelling.  Gastrointestinal:  Negative for abdominal pain, blood in stool, constipation, diarrhea, heartburn, melena, nausea and vomiting.  Genitourinary:  Negative for dysuria, frequency and urgency.  Musculoskeletal:  Negative for back pain and joint pain.  Skin: Negative.  Negative for itching and rash.  Neurological:  Negative for dizziness, tingling, focal weakness, weakness and headaches.  Endo/Heme/Allergies:  Does not bruise/bleed easily.  Psychiatric/Behavioral:  Negative for depression. The patient is not nervous/anxious and does not have insomnia.      MEDICAL HISTORY:  Past Medical History:  Diagnosis Date   Anxiety    Barrett esophagus    CAD (coronary artery disease), native coronary artery    Coronary CTA 06/2021 showed Coronary CTA showed coronary calcium score of 121 with 50 to 69% mixed plaque in the proximal RCA, 25 to 49% mixed plaque in the mid and distal LAD, moderate 50 to 69% mixed plaque in the first diagonal, mild ostial 25 to 50% stenosis of the ramus intermedius and mildly calcified aortic valve.   Diabetes  mellitus type 2, controlled (HCC)    DVT (deep venous thrombosis) (HCC)    Elevated LFTs    Esophageal reflux    Fatty liver    Gallbladder polyp    Gastric polyp 2020   hyperplastic   GERD (gastroesophageal reflux disease)    Hepatic steatosis    Hiatal hernia    Hypercholesterolemia    OSA (obstructive sleep apnea)    Sleep apnea     SURGICAL HISTORY: Past Surgical History:  Procedure Laterality Date   CARDIAC CATHETERIZATION  02/17/2008   Normal coronary arteries by cath   COLONOSCOPY     ESOPHAGEAL MANOMETRY N/A 09/26/2012   Procedure: ESOPHAGEAL MANOMETRY (EM);  Surgeon: Tessa Figures, MD;  Location: [facility];  Service: Endoscopy;  Laterality: N/A;   PLANTAR FASCIA SURGERY Left 2022   UPPER GASTROINTESTINAL ENDOSCOPY      SOCIAL HISTORY: Social History   Socioeconomic History   Marital status: Married    Spouse name: Not on file   Number of children: Not on file   Years of education: Not on file   Highest education level: Not on file  Occupational History   Occupation: Tree surgeon: UNEMPLOYED  Tobacco Use   Smoking status: Never   Smokeless tobacco: Never  Vaping Use   Vaping status: Never Used  Substance and Sexual Activity   Alcohol use: Yes    Comment: rare beer   Drug use: No   Sexual activity: Not on file  Other Topics Concern   Not on file  Social History Narrative   Not on file   Social Drivers of Health   Financial  Resource Strain: Not on file  Food Insecurity: No Food Insecurity (10/07/2022)   Hunger Vital Sign    Worried About Running Out of Food in the Last Year: Never true    Ran Out of Food in the Last Year: Never true  Transportation Needs: No Transportation Needs (10/07/2022)   PRAPARE - Administrator, Civil Service (Medical): No    Lack of Transportation (Non-Medical): No  Physical Activity: Not on file  Stress: Not on file  Social Connections: Not on file  Intimate Partner Violence: Not  At Risk (10/07/2022)   Humiliation, Afraid, Rape, and Kick questionnaire    Fear of Current or Ex-Partner: No    Emotionally Abused: No    Physically Abused: No    Sexually Abused: No    FAMILY HISTORY: Family History  Problem Relation Age of Onset   Diabetes Mother    Skin cancer Mother    Diabetes Father    Hypertension Brother    Diabetes Brother    Heart disease Paternal Grandfather    Heart disease Other    Colon cancer Neg Hx    Esophageal cancer Neg Hx    Pancreatic cancer Neg Hx    Liver cancer Neg Hx    Stomach cancer Neg Hx     ALLERGIES:  has no known allergies.  MEDICATIONS:  Current Outpatient Medications  Medication Sig Dispense Refill   atorvastatin (LIPITOR) 40 MG tablet Take 40 mg by mouth daily.     cycloSPORINE, PF, (CEQUA) 0.09 % SOLN 1 drop into affected eye Ophthalmic twice a day     metFORMIN (GLUCOPHAGE) 1000 MG tablet Take 1,000 mg by mouth 2 (two) times daily.     RABEprazole  (ACIPHEX ) 20 MG tablet Take 1 tablet (20 mg total) by mouth 2 (two) times daily. 180 tablet 3   Testosterone 1.62 % GEL Apply 1 Pump topically daily.     vitamin B-12 (CYANOCOBALAMIN ) 1000 MCG tablet Take 1,000 mcg by mouth daily.     vitamin E  400 UNIT capsule Take 800 Units by mouth daily. Two capsules daily     nitroGLYCERIN  (NITROSTAT ) 0.4 MG SL tablet Place 1 tablet (0.4 mg total) under the tongue every 5 (five) minutes as needed for chest pain. one tab every 5 minutes up to 3 tablets total over 15 minutes.  If no relief CALL 911. 25 tablet 3   No current facility-administered medications for this visit.    PHYSICAL EXAMINATION:  Vitals:   07/19/23 1249 07/19/23 1300  BP: (!) 136/94 (!) 141/77  Pulse: 89   Resp: 16   Temp: 97.8 F (36.6 C)   SpO2: 100%      Filed Weights   07/19/23 1249  Weight: 275 lb 14.4 oz (125.1 kg)      Physical Exam Vitals and nursing note reviewed.  HENT:     Head: Normocephalic and atraumatic.     Mouth/Throat:     Pharynx:  Oropharynx is clear.  Eyes:     Extraocular Movements: Extraocular movements intact.     Pupils: Pupils are equal, round, and reactive to light.  Cardiovascular:     Rate and Rhythm: Normal rate and regular rhythm.  Pulmonary:     Comments: Decreased breath sounds bilaterally.  Abdominal:     Palpations: Abdomen is soft.  Musculoskeletal:        General: Normal range of motion.     Cervical back: Normal range of motion.  Skin:    General:  Skin is warm.  Neurological:     General: No focal deficit present.     Mental Status: He is alert and oriented to person, place, and time.  Psychiatric:        Behavior: Behavior normal.        Judgment: Judgment normal.      LABORATORY DATA:  I have reviewed the data as listed Lab Results  Component Value Date   WBC 4.7 07/19/2023   HGB 14.4 07/19/2023   HCT 43.5 07/19/2023   MCV 80.1 07/19/2023   PLT 137 (L) 07/19/2023   Recent Labs    10/07/22 1443 07/19/23 1234  NA 137 136  K 4.2 4.0  CL 106 106  CO2 24 21*  GLUCOSE 140* 169*  BUN 17 17  CREATININE 1.13 1.05  CALCIUM 8.8* 8.9  GFRNONAA >60 >60  PROT 7.1 7.3  ALBUMIN 4.1 4.1  AST 24 27  ALT 26 30  ALKPHOS 70 80  BILITOT 0.8 0.9    RADIOGRAPHIC STUDIES: I have personally reviewed the radiological images as listed and agreed with the findings in the report. No results found.  Thrombocytopenia (HCC) Thrombocytopenia: Mild/moderate [AUG 2024- 94; normal hb/WBC: renal/LFTs].  The etiology is unclear. Denies any Exposure to Hepatitis/HIV. 2022- MRi liver- NEGATIVE for  cirrhosis/ splenomegaly. NOV 2024- platelets- 145; JUNE 2025- 137.    #  I do long discussion with patient regarding multiple etiologies including but not limited to liver disease/medications/autoimmune disease-ITP etc.  Less likely any malignant causes.  Discussed that ITP is a diagnosis of exclusion.  ITP is usually treated with steroids.     # DISPOSITION: # follow up in 6 months- MD;  labs-  cbc/cmp;LDH- Dr.B   All questions were answered. The patient knows to call the clinic with any problems, questions or concerns.    Gwyn Leos, MD 07/19/2023 1:39 PM

## 2023-07-19 NOTE — Progress Notes (Signed)
 Easy bruising: NO  Petechiae (bleeding under skin): NO  Gingival bleeding (gums): NO  Epistaxis (nose bleeds): NO  Hematochezia (blood in stools): NO  Hematuria (blood in urine): NO  Seeing endocrinology, Dr. Vick Gram, low testosterone, rx'd a gel.

## 2023-07-31 ENCOUNTER — Encounter (HOSPITAL_COMMUNITY): Payer: Self-pay

## 2023-07-31 ENCOUNTER — Ambulatory Visit (HOSPITAL_COMMUNITY)
Admission: EM | Admit: 2023-07-31 | Discharge: 2023-07-31 | Disposition: A | Discharge status: Home / Self Care | Attending: Family Medicine | Admitting: Family Medicine

## 2023-07-31 DIAGNOSIS — L989 Disorder of the skin and subcutaneous tissue, unspecified: Secondary | ICD-10-CM | POA: Diagnosis not present

## 2023-07-31 DIAGNOSIS — S80862A Insect bite (nonvenomous), left lower leg, initial encounter: Secondary | ICD-10-CM | POA: Diagnosis not present

## 2023-07-31 DIAGNOSIS — W57XXXA Bitten or stung by nonvenomous insect and other nonvenomous arthropods, initial encounter: Secondary | ICD-10-CM

## 2023-07-31 NOTE — ED Triage Notes (Signed)
 Patient states insect bite to his left calf area. Denies any fever/vomiting/diarrhea.

## 2023-08-02 NOTE — ED Provider Notes (Signed)
 [facility]   [id number] 07/31/23 Arrival Time: 1634  ASSESSMENT & PLAN:  1. Skin abnormality   2. Tick bite of calf, left, initial encounter    No sign of infection. Reports redness at site; discussed this is likely inflammatory localized reaction secondary to tick bite. Precautions to monitor for flu-like symptoms or fever along with any new rashes. Should these develop he will seek follow up. No indication for prophylactic doxycycline.   Reviewed expectations re: course of current medical issues. Questions answered. Outlined signs and symptoms indicating need for more acute intervention. Patient verbalized understanding. After Visit Summary given.   SUBJECTIVE: History from: patient. Timothy Dixon is a 63 y.o. male who reports suspicion for tick bite of upper L calf; approx 4 d ago. Afebrile. Has noted redness around suspected bite area. Without drainage or bleeding. Patient states insect bite to his left calf area. Denies any fever/vomiting/diarrhea.   OBJECTIVE:  Vitals:   07/31/23 1721  BP: 135/85  Pulse: 82  Resp: 16  Temp: 98.1 F (36.7 C)  TempSrc: Oral  SpO2: 94%    General appearance: alert; no distress Eyes: PERRLA; EOMI; conjunctiva normal Extremities: no edema; symmetrical with no gross deformities Skin: warm and dry; 2-3 mm circular area consistent with insect or tick bite; sub-cm surrounding erythema that is cool to touch; no drainage/bleeding Psychological: alert and cooperative; normal mood and affect  Labs:  Labs Reviewed - No data to display  Imaging: No results found.  No Known Allergies  Past Medical History:  Diagnosis Date   Anxiety    Barrett esophagus    CAD (coronary artery disease), native coronary artery    Coronary CTA 06/2021 showed Coronary CTA showed coronary calcium score of 121 with 50 to 69% mixed plaque in the proximal RCA, 25 to 49% mixed plaque in the mid and distal LAD, moderate 50 to 69% mixed plaque in  the first diagonal, mild ostial 25 to 50% stenosis of the ramus intermedius and mildly calcified aortic valve.   Diabetes mellitus type 2, controlled (HCC)    DVT (deep venous thrombosis) (HCC)    Elevated LFTs    Esophageal reflux    Fatty liver    Gallbladder polyp    Gastric polyp 2020   hyperplastic   GERD (gastroesophageal reflux disease)    Hepatic steatosis    Hiatal hernia    Hypercholesterolemia    OSA (obstructive sleep apnea)    Sleep apnea    Social History   Socioeconomic History   Marital status: Married    Spouse name: Not on file   Number of children: Not on file   Years of education: Not on file   Highest education level: Not on file  Occupational History   Occupation: Tree surgeon: UNEMPLOYED  Tobacco Use   Smoking status: Never   Smokeless tobacco: Never  Vaping Use   Vaping status: Never Used  Substance and Sexual Activity   Alcohol use: Yes    Comment: rare beer   Drug use: No   Sexual activity: Not on file  Other Topics Concern   Not on file  Social History Narrative   Not on file   Social Drivers of Health   Financial Resource Strain: Not on file  Food Insecurity: No Food Insecurity (10/07/2022)   Hunger Vital Sign    Worried About Running Out of Food in the Last Year: Never true    Ran Out of Food  in the Last Year: Never true  Transportation Needs: No Transportation Needs (10/07/2022)   PRAPARE - Administrator, Civil Service (Medical): No    Lack of Transportation (Non-Medical): No  Physical Activity: Not on file  Stress: Not on file  Social Connections: Not on file  Intimate Partner Violence: Not At Risk (10/07/2022)   Humiliation, Afraid, Rape, and Kick questionnaire    Fear of Current or Ex-Partner: No    Emotionally Abused: No    Physically Abused: No    Sexually Abused: No   Family History  Problem Relation Age of Onset   Diabetes Mother    Skin cancer Mother    Diabetes Father     Hypertension Brother    Diabetes Brother    Heart disease Paternal Grandfather    Heart disease Other    Colon cancer Neg Hx    Esophageal cancer Neg Hx    Pancreatic cancer Neg Hx    Liver cancer Neg Hx    Stomach cancer Neg Hx    Past Surgical History:  Procedure Laterality Date   CARDIAC CATHETERIZATION  02/17/2008   Normal coronary arteries by cath   COLONOSCOPY     ESOPHAGEAL MANOMETRY N/A 09/26/2012   Procedure: ESOPHAGEAL MANOMETRY (EM);  Surgeon: Tessa Figures, MD;  Location: [facility];  Service: Endoscopy;  Laterality: N/A;   PLANTAR FASCIA SURGERY Left 2022   UPPER GASTROINTESTINAL ENDOSCOPY        Afton Albright, MD 08/02/23 1407

## 2023-08-19 ENCOUNTER — Encounter: Payer: Self-pay | Admitting: Pharmacist

## 2023-08-31 ENCOUNTER — Other Ambulatory Visit: Payer: Self-pay | Admitting: Internal Medicine

## 2023-09-16 NOTE — Progress Notes (Unsigned)
 Ellouise Console, PA-C [address] [place], [place]  [address] Phone: [phone]   Primary Care Physician: Kip Righter, MD  Primary Gastroenterologist:  Ellouise Console, PA-C / Dr. Gordy Starch   Chief Complaint: Recurrent dysphagia, discuss EGD       HPI:   Timothy Dixon is a 63 y.o. male, established patient Dr. Starch, presents for evaluation of recurrent dysphagia.  He last saw Dr. Starch 08/2022.  Continued on Aciphex  20 Mg daily.  He has had multiple EGDs and colonoscopies.  Current symptoms: Patient reports recurrent dysphagia for 6 to 8 weeks.  He feels like pills are getting stuck in his throat.  Cold liquids are going down very slowly.  He has not had any food bolus or vomiting episodes.  He feels a globus sensation and mucus in his throat.  He reports previous esophageal dilations have relieved his symptoms in the past.  He is requesting a repeat EGD with dilation with Dr. Starch.  He denies breakthrough heartburn on Aciphex  20 Mg daily.  No other GI symptoms or concerns.  History of GERD, remote Barrett's esophagus not seen on most recent EGD, history of hiatal hernia, prior esophageal dysphagia responsive to dilation, history of gastric hyperplastic and fundic gland polyps, metabolic fatty liver disease, hyperlipidemia and previous provoked DVT off anticoagulation.  03/2021 last EGD: Small hiatal hernia.  Multiple benign fundic gland gastric polyps.   Normal duodenum.  Z-line slightly irregular.  No evidence for Barrett's.  Esophagus dilated with [address].  Biopsies: Negative for dysplasia or intestinal metaplasia.  3-year repeat EGD (03/2024).  03/2016 last colonoscopy: With 2-day Suprep: Good prep.  One small 3 mm hyperplastic rectal polyp removed.  10-year repeat (due 03/2026).  Current Outpatient Medications  Medication Sig Dispense Refill   AMBIEN 5 MG tablet Take 5 mg by mouth as needed.     atorvastatin (LIPITOR) 40 MG tablet Take 40 mg by mouth daily.      cycloSPORINE, PF, (CEQUA) 0.09 % SOLN 1 drop into affected eye Ophthalmic twice a day     metFORMIN (GLUCOPHAGE) 1000 MG tablet Take 1,000 mg by mouth 2 (two) times daily.     nitroGLYCERIN  (NITROSTAT ) 0.4 MG SL tablet Place 1 tablet (0.4 mg total) under the tongue every 5 (five) minutes as needed for chest pain. one tab every 5 minutes up to 3 tablets total over 15 minutes.  If no relief CALL 911. 25 tablet 3   RABEprazole  (ACIPHEX ) 20 MG tablet Take one tablet by mouth twice daily-Needs appt for furture refills 60 tablet 0   Testosterone 1.62 % GEL Apply 1 Pump topically daily.     vitamin B-12 (CYANOCOBALAMIN ) 1000 MCG tablet Take 1,000 mcg by mouth daily.     vitamin E  400 UNIT capsule Take 800 Units by mouth daily. Two capsules daily     No current facility-administered medications for this visit.    Allergies as of 09/17/2023   (No Known Allergies)    Past Medical History:  Diagnosis Date   Anxiety    Barrett esophagus    CAD (coronary artery disease), native coronary artery    Coronary CTA 06/2021 showed Coronary CTA showed coronary calcium score of 121 with 50 to 69% mixed plaque in the proximal RCA, 25 to 49% mixed plaque in the mid and distal LAD, moderate 50 to 69% mixed plaque in the first diagonal, mild ostial 25 to 50% stenosis of the ramus intermedius and  mildly calcified aortic valve.   Diabetes mellitus type 2, controlled (HCC)    DVT (deep venous thrombosis) (HCC)    Elevated LFTs    Esophageal reflux    Fatty liver    Gallbladder polyp    Gastric polyp 2020   hyperplastic   GERD (gastroesophageal reflux disease)    Hepatic steatosis    Hiatal hernia    Hypercholesterolemia    OSA (obstructive sleep apnea)    Sleep apnea     Past Surgical History:  Procedure Laterality Date   CARDIAC CATHETERIZATION  02/17/2008   Normal coronary arteries by cath   COLONOSCOPY     ESOPHAGEAL MANOMETRY N/A 09/26/2012   Procedure: ESOPHAGEAL MANOMETRY (EM);  Surgeon: Alm JONELLE Gander, MD;  Location: [facility];  Service: Endoscopy;  Laterality: N/A;   PLANTAR FASCIA SURGERY Left 2022   UPPER GASTROINTESTINAL ENDOSCOPY      Review of Systems:    All systems reviewed and negative except where noted in HPI.    Physical Exam:  BP 110/80 (BP Location: Left Arm, Patient Position: Sitting, Cuff Size: Large)   Pulse 85   Ht 6' 5 (1.956 m) Comment: height without shoes  Wt 272 lb 2 oz (123.4 kg)   BMI 32.27 kg/m  No LMP for male patient.  General: Well-nourished, well-developed in no acute distress.  Lungs: Clear to auscultation bilaterally. Non-labored. Heart: Regular rate and rhythm, no murmurs rubs or gallops.  Abdomen: Bowel sounds are normal; Abdomen is Soft; No hepatosplenomegaly, masses or hernias;  No Abdominal Tenderness; No guarding or rebound tenderness. Neuro: Alert and oriented x 3.  Grossly intact.  Psych: Alert and cooperative, normal mood and affect.   Imaging Studies: No results found.  Labs: CBC    Component Value Date/Time   WBC 4.7 07/19/2023 1234   WBC 4.8 10/07/2022 1443   RBC 5.43 07/19/2023 1234   HGB 14.4 07/19/2023 1234   HCT 43.5 07/19/2023 1234   PLT 137 (L) 07/19/2023 1234   MCV 80.1 07/19/2023 1234   MCH 26.5 07/19/2023 1234   MCHC 33.1 07/19/2023 1234   RDW 13.1 07/19/2023 1234   LYMPHSABS 1.6 07/19/2023 1234   MONOABS 0.5 07/19/2023 1234   EOSABS 0.1 07/19/2023 1234   BASOSABS 0.0 07/19/2023 1234    CMP     Component Value Date/Time   NA 136 07/19/2023 1234   K 4.0 07/19/2023 1234   CL 106 07/19/2023 1234   CO2 21 (L) 07/19/2023 1234   GLUCOSE 169 (H) 07/19/2023 1234   BUN 17 07/19/2023 1234   CREATININE 1.05 07/19/2023 1234   CALCIUM 8.9 07/19/2023 1234   PROT 7.3 07/19/2023 1234   ALBUMIN 4.1 07/19/2023 1234   AST 27 07/19/2023 1234   ALT 30 07/19/2023 1234   ALKPHOS 80 07/19/2023 1234   BILITOT 0.9 07/19/2023 1234   GFRNONAA >60 07/19/2023 1234   GFRAA  03/22/2007 2309    >60        The  eGFR has been calculated using the MDRD equation. This calculation has not been validated in all clinical     Assessment and Plan:   Timothy Dixon is a 63 y.o. y/o male presents for:  GERD with a history of remote Barrett's esophagus - Continue Aciphex  20 Mg once daily - Continue avoiding GERD trigger foods and drinks.  2.  Recurrent Dysphagia - Schedule repeat EGD w/ Dilation I discussed risks of EGD with patient to include risk of bleeding, perforation, and risk  of sedation.  Patient expressed understanding and agrees to proceed with EGD.   3.  History of hyperplastic and fundic gland gastric polyps --no dysplasia.  No bleeding.   - Schedule repeat EGD to survey gastric polyps  - Gave patient reassurance regarding benign fundic gland polyps.   4.  Colon cancer screening - Repeat screening colonoscopy will be due around February 2028   Ellouise Console, PA-C  Follow up based on EGD results and GI symptoms.

## 2023-09-17 ENCOUNTER — Encounter: Payer: Self-pay | Admitting: Internal Medicine

## 2023-09-17 ENCOUNTER — Ambulatory Visit: Admitting: Physician Assistant

## 2023-09-17 ENCOUNTER — Encounter: Payer: Self-pay | Admitting: Physician Assistant

## 2023-09-17 DIAGNOSIS — K219 Gastro-esophageal reflux disease without esophagitis: Secondary | ICD-10-CM | POA: Diagnosis not present

## 2023-09-17 DIAGNOSIS — R1310 Dysphagia, unspecified: Secondary | ICD-10-CM | POA: Diagnosis not present

## 2023-09-17 DIAGNOSIS — Z8719 Personal history of other diseases of the digestive system: Secondary | ICD-10-CM | POA: Diagnosis not present

## 2023-09-17 NOTE — Patient Instructions (Signed)
 _______________________________________________________  If your blood pressure at your visit was 140/90 or greater, please contact your primary care physician to follow up on this.  If you are age 61 or younger, your body mass index should be between 19-25. Your Body mass index is 32.27 kg/m. If this is out of the aformentioned range listed, please consider follow up with your Primary Care Provider.  ________________________________________________________  The [facility] providers would like to encourage you to use MYCHART to communicate with providers for non-urgent requests or questions.  Due to long hold times on the telephone, sending your provider a message by [facility] may be a faster and more efficient way to get a response.  Please allow 48 business hours for a response.  Please remember that this is for non-urgent requests.  _______________________________________________________  [facility] is using a team-based approach to care.  Your team is made up of your doctor and two to three APPS. Our APPS (Nurse Practitioners and Physician Assistants) work with your physician to ensure care continuity for you. They are fully qualified to address your health concerns and develop a treatment plan. They communicate directly with your gastroenterologist to care for you. Seeing the Advanced Practice Practitioners on your physician's team can help you by facilitating care more promptly, often allowing for earlier appointments, access to diagnostic testing, procedures, and other specialty referrals.   You have been scheduled for an endoscopy. Please follow written instructions given to you at your visit today.  If you use inhalers (even only as needed), please bring them with you on the day of your procedure.  If you take any of the following medications, they will need to be adjusted prior to your procedure:   DO NOT TAKE 7 DAYS PRIOR TO TEST- Trulicity (dulaglutide) Ozempic, Wegovy  (semaglutide) Mounjaro (tirzepatide) Bydureon Bcise (exanatide extended release)  DO NOT TAKE 1 DAY PRIOR TO YOUR TEST Rybelsus (semaglutide) Adlyxin (lixisenatide) Victoza (liraglutide) Byetta (exanatide) ___________________________________________________________________________  Due to recent changes in healthcare laws, you may see the results of your imaging and laboratory studies on MyChart before your provider has had a chance to review them.  We understand that in some cases there may be results that are confusing or concerning to you. Not all laboratory results come back in the same time frame and the provider may be waiting for multiple results in order to interpret others.  Please give us  48 hours in order for your provider to thoroughly review all the results before contacting the office for clarification of your results.   Thank you for entrusting me with your care and choosing [facility].  Ellouise Console, PA-C

## 2023-09-30 ENCOUNTER — Other Ambulatory Visit: Payer: Self-pay | Admitting: Internal Medicine

## 2023-09-30 ENCOUNTER — Telehealth: Payer: Self-pay | Admitting: Internal Medicine

## 2023-09-30 NOTE — Telephone Encounter (Signed)
 Received automated rx request from pharmacy requesting Aciphex twice daily. According to Ellouise Garrett's last office note, patient is taking Aciphex once daily. Called patient and confirmed he is taking the Aciphex twice daily. Prescription sent to the pharmacy.

## 2023-09-30 NOTE — Telephone Encounter (Signed)
 Inbound call from patient stating pharmacy says medication prescription sent in for Aciphex was for one pill daily and patient stated he was prescribed that medication to take twice a day would like it sent to pharmacy again stating its 2 pills a day   Please advise  Thank you

## 2023-10-14 ENCOUNTER — Ambulatory Visit: Admitting: Internal Medicine

## 2023-10-14 ENCOUNTER — Encounter: Payer: Self-pay | Admitting: Internal Medicine

## 2023-10-14 VITALS — BP 137/84 | HR 70 | Temp 97.3°F | Resp 16 | Ht 77.0 in | Wt 272.0 lb

## 2023-10-14 DIAGNOSIS — K2289 Other specified disease of esophagus: Secondary | ICD-10-CM

## 2023-10-14 DIAGNOSIS — K219 Gastro-esophageal reflux disease without esophagitis: Secondary | ICD-10-CM

## 2023-10-14 DIAGNOSIS — K317 Polyp of stomach and duodenum: Secondary | ICD-10-CM | POA: Diagnosis not present

## 2023-10-14 DIAGNOSIS — K229 Disease of esophagus, unspecified: Secondary | ICD-10-CM

## 2023-10-14 DIAGNOSIS — R1310 Dysphagia, unspecified: Secondary | ICD-10-CM | POA: Diagnosis not present

## 2023-10-14 MED ORDER — SODIUM CHLORIDE 0.9 % IV SOLN: 500.0000 mL | INTRAVENOUS | Status: DC

## 2023-10-14 NOTE — Progress Notes (Signed)
 See office note dated 09/17/2023 for details of current H&P  Patient with history of GERD with remote Barrett's esophagus on Aciphex  20 mg daily, recurrent dysphagia responsive to dilation in February 2023  History of benign gastric polyps  He remains appropriate for upper endoscopy with possible dilation in the [facility] today.

## 2023-10-14 NOTE — Progress Notes (Signed)
 Called to room to assist during endoscopic procedure.  Patient ID and intended procedure confirmed with present staff. Received instructions for my participation in the procedure from the performing physician.

## 2023-10-14 NOTE — Op Note (Signed)
 [facility] Patient Name: Timothy Dixon Procedure Date: 10/14/2023 2:00 PM MRN: [id number] Endoscopist: Gordy CHRISTELLA Starch , MD, [id number] Age: 63 Referring MD:  Date of Birth: [birth year] Gender: Male Account #: [id number] Procedure:                Upper GI endoscopy Indications:              Dysphagia responsive to previous dilation,                            Gastro-esophageal reflux disease, hx of remote                            Barrett's esophagus (short segment), hx of benign                            gastric polyps Medicines:                Monitored Anesthesia Care Procedure:                Pre-Anesthesia Assessment:                           - Prior to the procedure, a History and Physical                            was performed, and patient medications and                            allergies were reviewed. The patient's tolerance of                            previous anesthesia was also reviewed. The risks                            and benefits of the procedure and the sedation                            options and risks were discussed with the patient.                            All questions were answered, and informed consent                            was obtained. Prior Anticoagulants: The patient has                            taken no anticoagulant or antiplatelet agents. ASA                            Grade Assessment: III - A patient with severe                            systemic disease. After reviewing the risks and  benefits, the patient was deemed in satisfactory                            condition to undergo the procedure.                           After obtaining informed consent, the endoscope was                            passed under direct vision. Throughout the                            procedure, the patient's blood pressure, pulse, and                            oxygen saturations were monitored  continuously. The                            Olympus Scope [id number] was introduced through the                            mouth, and advanced to the second part of duodenum.                            The upper GI endoscopy was accomplished without                            difficulty. The patient tolerated the procedure                            well. Scope In: Scope Out: Findings:                 The Z-line was irregular and was found 40 cm from                            the incisors. Biopsies were taken with a cold                            forceps for histology.                           No endoscopic abnormality was evident in the                            esophagus to explain the patient's complaint of                            dysphagia. It was decided, however, to proceed with                            dilation of the entire esophagus. The scope was                            withdrawn. Dilation  was performed with a Maloney                            dilator with mild resistance at 54 Fr.                           Multiple 3 to 10 mm pedunculated and sessile polyps                            with no bleeding were found in the gastric fundus                            and in the gastric body. Biopsies were taken with a                            cold forceps for histology.                           The exam of the stomach was otherwise normal.                           The examined duodenum was normal. Complications:            No immediate complications. Estimated Blood Loss:     Estimated blood loss was minimal. Impression:               - Z-line irregular, 40 cm from the incisors.                            Biopsied to exclude Barrett's esophagus.                           - No endoscopic esophageal abnormality to explain                            patient's dysphagia. Esophagus dilated with 54 Fr.                            Maloney.                           -  Multiple gastric polyps. Benign-appearing.                            Biopsied.                           - Normal examined duodenum. Recommendation:           - Patient has a contact number available for                            emergencies. The signs and symptoms of potential                            delayed complications were discussed with the  patient. Return to normal activities tomorrow.                            Written discharge instructions were provided to the                            patient.                           - Resume previous diet.                           - Continue present medications.                           - Await pathology results.                           - Given history of hepatic steatosis, FibroScan is                            recommended when available at our office (3-6                            months). Gordy CHRISTELLA Starch, MD 10/14/2023 2:34:32 PM This report has been signed electronically.

## 2023-10-14 NOTE — Progress Notes (Signed)
 Pt's states no medical or surgical changes since previsit or office visit.

## 2023-10-14 NOTE — Progress Notes (Signed)
 Sedate, gd SR, tolerated procedure well, VSS, report to RN

## 2023-10-14 NOTE — Patient Instructions (Signed)
 Resume previous diet  Continue present medications  Await pathology results  Given history of hepatic steatosis, FibroScan is recommended with available at our office (3-6 months). YOU HAD AN ENDOSCOPIC PROCEDURE TODAY AT THE [facility]:   Refer to the procedure report that was given to you for any specific questions about what was found during the examination.  If the procedure report does not answer your questions, please call your gastroenterologist to clarify.  If you requested that your care partner not be given the details of your procedure findings, then the procedure report has been included in a sealed envelope for you to review at your convenience later.  YOU SHOULD EXPECT: Some feelings of bloating in the abdomen. Passage of more gas than usual.  Walking can help get rid of the air that was put into your GI tract during the procedure and reduce the bloating. If you had a lower endoscopy (such as a colonoscopy or flexible sigmoidoscopy) you may notice spotting of blood in your stool or on the toilet paper. If you underwent a bowel prep for your procedure, you may not have a normal bowel movement for a few days.  Please Note:  You might notice some irritation and congestion in your nose or some drainage.  This is from the oxygen used during your procedure.  There is no need for concern and it should clear up in a day or so.  SYMPTOMS TO REPORT IMMEDIATELY: Following upper endoscopy (EGD)  Vomiting of blood or coffee ground material  New chest pain or pain under the shoulder blades  Painful or persistently difficult swallowing  New shortness of breath  Fever of 100F or higher  Black, tarry-looking stools  For urgent or emergent issues, a gastroenterologist can be reached at any hour by calling [phone]. Do not use MyChart messaging for urgent concerns.   DIET:  We do recommend a small meal at first, but then you may proceed to your regular diet.  Drink plenty of  fluids but you should avoid alcoholic beverages for 24 hours.  ACTIVITY:  You should plan to take it easy for the rest of today and you should NOT DRIVE or use heavy machinery until tomorrow (because of the sedation medicines used during the test).    FOLLOW UP: Our staff will call the number listed on your records the next business day following your procedure.  We will call around 7:15- 8:00 am to check on you and address any questions or concerns that you may have regarding the information given to you following your procedure. If we do not reach you, we will leave a message.     If any biopsies were taken you will be contacted by phone or by letter within the next 1-3 weeks.  Please call us  at [phone] if you have not heard about the biopsies in 3 weeks.   SIGNATURES/CONFIDENTIALITY: You and/or your care partner have signed paperwork which will be entered into your electronic medical record.  These signatures attest to the fact that that the information above on your After Visit Summary has been reviewed and is understood.  Full responsibility of the confidentiality of this discharge information lies with you and/or your care-partner.

## 2023-10-15 ENCOUNTER — Telehealth: Payer: Self-pay | Admitting: *Deleted

## 2023-10-15 NOTE — Telephone Encounter (Signed)
 No answer after post call. Left a message.

## 2023-10-20 ENCOUNTER — Ambulatory Visit: Payer: Self-pay | Admitting: Internal Medicine

## 2023-10-20 LAB — SURGICAL PATHOLOGY

## 2024-01-18 ENCOUNTER — Inpatient Hospital Stay: Attending: Internal Medicine

## 2024-01-18 ENCOUNTER — Inpatient Hospital Stay: Admitting: Internal Medicine

## 2024-01-18 ENCOUNTER — Encounter: Payer: Self-pay | Admitting: Internal Medicine

## 2024-01-18 VITALS — BP 122/86 | HR 75 | Temp 97.4°F | Resp 19 | Wt 271.1 lb

## 2024-01-18 DIAGNOSIS — F419 Anxiety disorder, unspecified: Secondary | ICD-10-CM | POA: Insufficient documentation

## 2024-01-18 DIAGNOSIS — E119 Type 2 diabetes mellitus without complications: Secondary | ICD-10-CM | POA: Diagnosis not present

## 2024-01-18 DIAGNOSIS — D649 Anemia, unspecified: Secondary | ICD-10-CM | POA: Diagnosis not present

## 2024-01-18 DIAGNOSIS — E785 Hyperlipidemia, unspecified: Secondary | ICD-10-CM | POA: Insufficient documentation

## 2024-01-18 DIAGNOSIS — D696 Thrombocytopenia, unspecified: Secondary | ICD-10-CM | POA: Diagnosis not present

## 2024-01-18 DIAGNOSIS — Z833 Family history of diabetes mellitus: Secondary | ICD-10-CM | POA: Insufficient documentation

## 2024-01-18 DIAGNOSIS — E1169 Type 2 diabetes mellitus with other specified complication: Secondary | ICD-10-CM | POA: Insufficient documentation

## 2024-01-18 DIAGNOSIS — Z560 Unemployment, unspecified: Secondary | ICD-10-CM | POA: Insufficient documentation

## 2024-01-18 DIAGNOSIS — G4733 Obstructive sleep apnea (adult) (pediatric): Secondary | ICD-10-CM | POA: Insufficient documentation

## 2024-01-18 DIAGNOSIS — Z86718 Personal history of other venous thrombosis and embolism: Secondary | ICD-10-CM | POA: Insufficient documentation

## 2024-01-18 DIAGNOSIS — Z808 Family history of malignant neoplasm of other organs or systems: Secondary | ICD-10-CM | POA: Insufficient documentation

## 2024-01-18 DIAGNOSIS — Z79890 Hormone replacement therapy (postmenopausal): Secondary | ICD-10-CM | POA: Insufficient documentation

## 2024-01-18 DIAGNOSIS — Z8249 Family history of ischemic heart disease and other diseases of the circulatory system: Secondary | ICD-10-CM | POA: Diagnosis not present

## 2024-01-18 DIAGNOSIS — Z8719 Personal history of other diseases of the digestive system: Secondary | ICD-10-CM | POA: Insufficient documentation

## 2024-01-18 DIAGNOSIS — I2510 Atherosclerotic heart disease of native coronary artery without angina pectoris: Secondary | ICD-10-CM | POA: Insufficient documentation

## 2024-01-18 DIAGNOSIS — Z79899 Other long term (current) drug therapy: Secondary | ICD-10-CM | POA: Diagnosis not present

## 2024-01-18 DIAGNOSIS — G4700 Insomnia, unspecified: Secondary | ICD-10-CM | POA: Insufficient documentation

## 2024-01-18 DIAGNOSIS — Z8261 Family history of arthritis: Secondary | ICD-10-CM | POA: Diagnosis not present

## 2024-01-18 DIAGNOSIS — E291 Testicular hypofunction: Secondary | ICD-10-CM | POA: Insufficient documentation

## 2024-01-18 DIAGNOSIS — R209 Unspecified disturbances of skin sensation: Secondary | ICD-10-CM | POA: Insufficient documentation

## 2024-01-18 LAB — CMP (CANCER CENTER ONLY)
ALT: 23 U/L (ref 0–44)
AST: 20 U/L (ref 15–41)
Albumin: 4 g/dL (ref 3.5–5.0)
Alkaline Phosphatase: 68 U/L (ref 38–126)
Anion gap: 10 (ref 5–15)
BUN: 18 mg/dL (ref 6–20)
CO2: 23 mmol/L (ref 22–32)
Calcium: 9 mg/dL (ref 8.9–10.3)
Chloride: 103 mmol/L (ref 98–111)
Creatinine: 1.18 mg/dL (ref 0.61–1.24)
GFR, Estimated: >60 mL/min (ref >60)
Glucose, Bld: 137 mg/dL — ABNORMAL HIGH (ref 70–99)
Potassium: 4 mmol/L (ref 3.5–5.1)
Sodium: 136 mmol/L (ref 135–145)
Total Bilirubin: 1 mg/dL (ref 0.0–1.2)
Total Protein: 6.9 g/dL (ref 6.5–8.1)

## 2024-01-18 LAB — CBC WITH DIFFERENTIAL (CANCER CENTER ONLY)
Abs Immature Granulocytes: 0.01 K/uL (ref 0.00–0.07)
Basophils Absolute: 0 K/uL (ref 0.0–0.1)
Basophils Relative: 0 %
Eosinophils Absolute: 0.1 K/uL (ref 0.0–0.5)
Eosinophils Relative: 3 %
HCT: 40.7 % (ref 39.0–52.0)
Hemoglobin: 14.1 g/dL (ref 13.0–17.0)
Immature Granulocytes: 0 %
Lymphocytes Relative: 36 %
Lymphs Abs: 1.6 K/uL (ref 0.7–4.0)
MCH: 27.2 pg (ref 26.0–34.0)
MCHC: 34.6 g/dL (ref 30.0–36.0)
MCV: 78.4 fL — ABNORMAL LOW (ref 80.0–100.0)
Monocytes Absolute: 0.5 K/uL (ref 0.1–1.0)
Monocytes Relative: 10 %
Neutro Abs: 2.3 K/uL (ref 1.7–7.7)
Neutrophils Relative %: 51 %
Platelet Count: 128 K/uL — ABNORMAL LOW (ref 150–400)
RBC: 5.19 MIL/uL (ref 4.22–5.81)
RDW: 13 % (ref 11.5–15.5)
WBC Count: 4.6 K/uL (ref 4.0–10.5)
nRBC: 0 % (ref 0.0–0.2)

## 2024-01-18 LAB — LACTATE DEHYDROGENASE: LDH: 104 U/L — ABNORMAL LOW (ref 105–235)

## 2024-01-18 NOTE — Assessment & Plan Note (Addendum)
 Thrombocytopenia: Mild/moderate [AUG 2024- 94; normal hb/WBC: renal/LFTs].  The etiology is unclear. Denies any Exposure to Hepatitis/HIV. 2022- MRi liver- NEGATIVE for  cirrhosis/ splenomegaly. NOV 2024- platelets- 145; JUNE 2025- 137.  DEC 2025- 128.   #  I do long discussion with patient regarding multiple etiologies including but not limited to liver disease/medications/autoimmune disease-ITP etc.  Less likely any malignant causes.  Discussed that ITP is a diagnosis of exclusion.  ITP is usually treated with steroids.    # hand joints- ? OA vs RA [autoimmune]. Defer to PCP re: further work up  # DISPOSITION: # follow up in 6 months- MD;  labs- cbc/cmp;LDH- Dr.B

## 2024-01-18 NOTE — Progress Notes (Signed)
 Patient has no concerns

## 2024-01-18 NOTE — Progress Notes (Signed)
 [facility] CONSULT NOTE  Patient Care Team: Kip Righter, MD as PCP - General (Family Medicine) Shlomo Wilbert SAUNDERS, MD as PCP - Cardiology (Cardiology) Rennie Timothy SAUNDERS, MD as Consulting Physician (Oncology)  CHIEF COMPLAINTS/PURPOSE OF CONSULTATION: Thrombocytopenia  HISTORY OF PRESENTING ILLNESS: Patient ambulating-independently. Alone.  Timothy Dixon 63 y.o.  male is here for a follow up of  thrombocytopenia.  Discussed the use of AI scribe software for clinical note transcription with the patient, who gave verbal consent to proceed.  History of Present Illness   Timothy Dixon is a 63 year old male with thrombocytopenia who presents for follow-up.  His platelet count today is 128,000/L, which is slightly lower than previous counts but remains above the threshold of concern.  He experiences stiffness in his hands, particularly in the mornings, described as 'real stiff and kind of sore.' This stiffness resolves after movement or a warm shower. He has not previously discussed these symptoms with his family doctor or seen a rheumatologist. He is concerned about the possibility of arthritis, noting a family history of arthritis in his mother and grandmother.  He inquires about his anemia status and is informed that his hemoglobin is normal at 14 g/dL. Additionally, his kidney and liver function tests are normal.      Review of Systems  Constitutional:  Negative for chills, diaphoresis, fever, malaise/fatigue and weight loss.  HENT:  Negative for nosebleeds and sore throat.   Eyes:  Negative for double vision.  Respiratory:  Negative for cough, hemoptysis, sputum production, shortness of breath and wheezing.   Cardiovascular:  Negative for chest pain, palpitations, orthopnea and leg swelling.  Gastrointestinal:  Negative for abdominal pain, blood in stool, constipation, diarrhea, heartburn, melena, nausea and vomiting.  Genitourinary:  Negative for dysuria,  frequency and urgency.  Musculoskeletal:  Negative for back pain and joint pain.  Skin: Negative.  Negative for itching and rash.  Neurological:  Negative for dizziness, tingling, focal weakness, weakness and headaches.  Endo/Heme/Allergies:  Does not bruise/bleed easily.  Psychiatric/Behavioral:  Negative for depression. The patient is not nervous/anxious and does not have insomnia.      MEDICAL HISTORY:  Past Medical History:  Diagnosis Date   Anxiety    Barrett esophagus    CAD (coronary artery disease), native coronary artery    Coronary CTA 06/2021 showed Coronary CTA showed coronary calcium score of 121 with 50 to 69% mixed plaque in the proximal RCA, 25 to 49% mixed plaque in the mid and distal LAD, moderate 50 to 69% mixed plaque in the first diagonal, mild ostial 25 to 50% stenosis of the ramus intermedius and mildly calcified aortic valve.   Diabetes mellitus type 2, controlled (HCC)    DVT (deep venous thrombosis) (HCC)    Elevated LFTs    Esophageal reflux    Fatty liver    Gallbladder polyp    Gastric polyp 2020   hyperplastic   GERD (gastroesophageal reflux disease)    Hepatic steatosis    Hiatal hernia    Hypercholesterolemia    OSA (obstructive sleep apnea)    Sleep apnea     SURGICAL HISTORY: Past Surgical History:  Procedure Laterality Date   CARDIAC CATHETERIZATION  02/17/2008   Normal coronary arteries by cath   COLONOSCOPY     ESOPHAGEAL MANOMETRY N/A 09/26/2012   Procedure: ESOPHAGEAL MANOMETRY (EM);  Surgeon: Alm Dixon Gander, MD;  Location: [facility];  Service: Endoscopy;  Laterality: N/A;   PLANTAR FASCIA SURGERY Left  2022   UPPER GASTROINTESTINAL ENDOSCOPY      SOCIAL HISTORY: Social History   Socioeconomic History   Marital status: Married    Spouse name: Not on file   Number of children: Not on file   Years of education: Not on file   Highest education level: Not on file  Occupational History   Occupation: Production Designer, Theatre/television/film: UNEMPLOYED  Tobacco Use   Smoking status: Never   Smokeless tobacco: Never  Vaping Use   Vaping status: Never Used  Substance and Sexual Activity   Alcohol use: Yes    Comment: rare beer   Drug use: No   Sexual activity: Not on file  Other Topics Concern   Not on file  Social History Narrative   Not on file   Social Drivers of Health   Financial Resource Strain: Not on file  Food Insecurity: No Food Insecurity (10/07/2022)   Hunger Vital Sign    Worried About Running Out of Food in the Last Year: Never true    Ran Out of Food in the Last Year: Never true  Transportation Needs: No Transportation Needs (10/07/2022)   PRAPARE - Administrator, Civil Service (Medical): No    Lack of Transportation (Non-Medical): No  Physical Activity: Not on file  Stress: Not on file  Social Connections: Not on file  Intimate Partner Violence: Not At Risk (10/07/2022)   Humiliation, Afraid, Rape, and Kick questionnaire    Fear of Current or Ex-Partner: No    Emotionally Abused: No    Physically Abused: No    Sexually Abused: No    FAMILY HISTORY: Family History  Problem Relation Age of Onset   Diabetes Mother    Skin cancer Mother    Diabetes Father    Hypertension Brother    Diabetes Brother    Heart disease Paternal Grandfather    Heart disease Other    Colon cancer Neg Hx    Esophageal cancer Neg Hx    Pancreatic cancer Neg Hx    Liver cancer Neg Hx    Stomach cancer Neg Hx     ALLERGIES:  has no known allergies.  MEDICATIONS:  Current Outpatient Medications  Medication Sig Dispense Refill   AMBIEN 5 MG tablet Take 5 mg by mouth as needed.     atorvastatin (LIPITOR) 40 MG tablet Take 40 mg by mouth daily.     cycloSPORINE, PF, (CEQUA) 0.09 % SOLN 1 drop into affected eye Ophthalmic twice a day     metFORMIN (GLUCOPHAGE) 1000 MG tablet Take 1,000 mg by mouth 2 (two) times daily.     nitroGLYCERIN  (NITROSTAT ) 0.4 MG SL tablet Place 1 tablet (0.4 mg total)  under the tongue every 5 (five) minutes as needed for chest pain. one tab every 5 minutes up to 3 tablets total over 15 minutes.  If no relief CALL 911. 25 tablet 3   RABEprazole  (ACIPHEX ) 20 MG tablet TAKE 1 TABLET BY MOUTH TWICE DAILY 60 tablet 5   Testosterone 1.62 % GEL Apply 1 Pump topically daily.     vitamin B-12 (CYANOCOBALAMIN ) 1000 MCG tablet Take 1,000 mcg by mouth daily.     vitamin E  400 UNIT capsule Take 800 Units by mouth daily. Two capsules daily     No current facility-administered medications for this visit.    PHYSICAL EXAMINATION:  Vitals:   01/18/24 0858  BP: 122/86  Pulse: 75  Resp: 19  Temp: (!)  97.4 F (36.3 C)  SpO2: 98%     Filed Weights   01/18/24 0858  Weight: 271 lb 1.6 oz (123 kg)      Physical Exam Vitals and nursing note reviewed.  HENT:     Head: Normocephalic and atraumatic.     Mouth/Throat:     Pharynx: Oropharynx is clear.  Eyes:     Extraocular Movements: Extraocular movements intact.     Pupils: Pupils are equal, round, and reactive to light.  Cardiovascular:     Rate and Rhythm: Normal rate and regular rhythm.  Pulmonary:     Comments: Decreased breath sounds bilaterally.  Abdominal:     Palpations: Abdomen is soft.  Musculoskeletal:        General: Normal range of motion.     Cervical back: Normal range of motion.  Skin:    General: Skin is warm.  Neurological:     General: No focal deficit present.     Mental Status: He is alert and oriented to person, place, and time.  Psychiatric:        Behavior: Behavior normal.        Judgment: Judgment normal.      LABORATORY DATA:  I have reviewed the data as listed Lab Results  Component Value Date   WBC 4.6 01/18/2024   HGB 14.1 01/18/2024   HCT 40.7 01/18/2024   MCV 78.4 (L) 01/18/2024   PLT 128 (L) 01/18/2024   Recent Labs    07/19/23 1234 01/18/24 0848  NA 136 136  K 4.0 4.0  CL 106 103  CO2 21* 23  GLUCOSE 169* 137*  BUN 17 18  CREATININE 1.05 1.18   CALCIUM 8.9 9.0  GFRNONAA >60 >60  PROT 7.3 6.9  ALBUMIN 4.1 4.0  AST 27 20  ALT 30 23  ALKPHOS 80 68  BILITOT 0.9 1.0    RADIOGRAPHIC STUDIES: I have personally reviewed the radiological images as listed and agreed with the findings in the report. No results found.  Thrombocytopenia Thrombocytopenia: Mild/moderate [AUG 2024- 94; normal hb/WBC: renal/LFTs].  The etiology is unclear. Denies any Exposure to Hepatitis/HIV. 2022- MRi liver- NEGATIVE for  cirrhosis/ splenomegaly. NOV 2024- platelets- 145; JUNE 2025- 137.  DEC 2025- 128.   #  I do long discussion with patient regarding multiple etiologies including but not limited to liver disease/medications/autoimmune disease-ITP etc.  Less likely any malignant causes.  Discussed that ITP is a diagnosis of exclusion.  ITP is usually treated with steroids.    # hand joints- ? OA vs RA [autoimmune]. Defer to PCP re: further work up  # DISPOSITION: # follow up in 6 months- MD;  labs- cbc/cmp;LDH- Dr.B    All questions were answered. The patient knows to call the clinic with any problems, questions or concerns.    Timothy JONELLE Joe, MD 01/18/2024 10:10 AM

## 2024-03-17 ENCOUNTER — Other Ambulatory Visit: Payer: Self-pay | Admitting: Internal Medicine

## 2024-06-07 ENCOUNTER — Ambulatory Visit: Admitting: Cardiology

## 2024-08-08 ENCOUNTER — Inpatient Hospital Stay: Admitting: Internal Medicine

## 2024-08-08 ENCOUNTER — Inpatient Hospital Stay
